# Patient Record
Sex: Female | Born: 1980 | Race: White | Hispanic: No | Marital: Married | Smoking: Never smoker
Health system: Southern US, Community
[De-identification: ages and names within clinical notes are randomized; demographics above are authoritative.]

## PROBLEM LIST (undated history)

## (undated) DIAGNOSIS — D279 Benign neoplasm of unspecified ovary: Secondary | ICD-10-CM

## (undated) DIAGNOSIS — R112 Nausea with vomiting, unspecified: Secondary | ICD-10-CM

## (undated) DIAGNOSIS — Z9889 Other specified postprocedural states: Secondary | ICD-10-CM

## (undated) DIAGNOSIS — N200 Calculus of kidney: Secondary | ICD-10-CM

## (undated) DIAGNOSIS — F411 Generalized anxiety disorder: Secondary | ICD-10-CM

## (undated) DIAGNOSIS — F3281 Premenstrual dysphoric disorder: Secondary | ICD-10-CM

## (undated) DIAGNOSIS — K219 Gastro-esophageal reflux disease without esophagitis: Secondary | ICD-10-CM

## (undated) DIAGNOSIS — R569 Unspecified convulsions: Secondary | ICD-10-CM

## (undated) DIAGNOSIS — Z87442 Personal history of urinary calculi: Secondary | ICD-10-CM

## (undated) HISTORY — DX: Gastro-esophageal reflux disease without esophagitis: K21.9

## (undated) HISTORY — DX: Unspecified convulsions: R56.9

## (undated) HISTORY — PX: AUGMENTATION MAMMAPLASTY: SUR837

## (undated) HISTORY — DX: Calculus of kidney: N20.0

---

## 2004-07-16 HISTORY — PX: AUGMENTATION MAMMAPLASTY: SUR837

## 2010-07-16 DIAGNOSIS — G40109 Localization-related (focal) (partial) symptomatic epilepsy and epileptic syndromes with simple partial seizures, not intractable, without status epilepticus: Secondary | ICD-10-CM

## 2010-07-16 HISTORY — DX: Localization-related (focal) (partial) symptomatic epilepsy and epileptic syndromes with simple partial seizures, not intractable, without status epilepticus: G40.109

## 2011-08-17 HISTORY — PX: HYSTEROSCOPY: SHX211

## 2011-08-17 HISTORY — PX: HYSTEROSCOPY WITH D & C: SHX1775

## 2013-02-04 ENCOUNTER — Encounter: Payer: Self-pay | Admitting: Diagnostic Neuroimaging

## 2013-02-04 ENCOUNTER — Ambulatory Visit (INDEPENDENT_AMBULATORY_CARE_PROVIDER_SITE_OTHER): Payer: BC Managed Care – PPO | Admitting: Diagnostic Neuroimaging

## 2013-02-04 ENCOUNTER — Telehealth: Payer: Self-pay | Admitting: Diagnostic Neuroimaging

## 2013-02-04 VITALS — BP 97/66 | HR 85 | Temp 98.7°F | Ht 64.0 in | Wt 126.0 lb

## 2013-02-04 DIAGNOSIS — R569 Unspecified convulsions: Secondary | ICD-10-CM

## 2013-02-04 MED ORDER — LEVETIRACETAM ER 500 MG PO TB24: 1000.0000 mg | ORAL_TABLET | Freq: Every day | ORAL | Status: DC

## 2013-02-04 NOTE — Patient Instructions (Signed)
Stop regular levetiracetam.  Start levetiracetam XR (extended release) 1000mg  at bedtime.

## 2013-02-04 NOTE — Telephone Encounter (Signed)
Patient is requesting none extended release of generic Keppra. Too expensive.

## 2013-02-04 NOTE — Progress Notes (Signed)
[facility]  PATIENT: Beth Vasquez DOB: June 14, 1981  REFERRING CLINICIAN: Pfeiffer HISTORY FROM: patient  REASON FOR VISIT: new consult   HISTORICAL  CHIEF COMPLAINT:  Chief Complaint  Patient presents with  . Seizures    Np #6    HISTORY OF PRESENT ILLNESS:   32 year old right-handed female here for evaluation of seizure disorder.  Patient was born full-term, no complications with normal development. Family history seizure only notable in patient's mother's uncle. Patient has had 2 episodes of seizure in her life on 01/04/11 and 03/20/11. First seizure, patient was in the car with her husband and suddenly became unresponsive without warning. She has generalized convulsions. Took her about 20 minutes to regain consciousness. She was slowing confused for several hours. She bit her tongue but did not have incontinence. Second episode occurred in a similar fashion to the first. Following the second seizure patient started on levetiracetam 500 mg twice a day. Within a few months, patient was complaining of depression and being in the ears as well as hair loss and therefore levetiracetam dose was decreased to 50 mg the morning and 500 mg at night.  Around this time patient began to have intermittent episodes lasting 1 minute at a time, of expressive and receptive aphasia. She did not lose consciousness. She is able to stand up sit down involuntary move her arms and legs. Sometimes she is able to get one or 2 words out. These events occur once per week, in clusters of up to 5 in a day.  Patient had video EEG monitoring in [place], which she thinks showed left frontal epileptiform discharges.  Patient was pregnant in 2013, and stayed on levetiracetam during the pregnancy. She had no seizures during pregnancy. She gave birth to 2 healthy twin girls, who are now 57 months old.  REVIEW OF SYSTEMS: Full 14 system review of systems performed and notable only for seizure ringing  in ears.  ALLERGIES: No Known Allergies  HOME MEDICATIONS: No outpatient prescriptions prior to visit.   No facility-administered medications prior to visit.    PAST MEDICAL HISTORY: Past Medical History  Diagnosis Date  . Seizures   . Acid reflux     PAST SURGICAL HISTORY: Past Surgical History  Procedure Laterality Date  . Cesarean section    . Hysteroscopy  08/2011    FAMILY HISTORY: Family History  Problem Relation Age of Onset  . Breast cancer Mother     SOCIAL HISTORY:  History   Social History  . Marital Status: Married    Spouse Name: N/A    Number of Children: 2  . Years of Education: 12   Occupational History  . N/A    Social History Main Topics  . Smoking status: Never Smoker   . Smokeless tobacco: Not on file  . Alcohol Use: Yes  . Drug Use: No  . Sexually Active: Not on file   Other Topics Concern  . Not on file   Social History Narrative  . No narrative on file     PHYSICAL EXAM  Filed Vitals:   02/04/13 0848  BP: 97/66  Pulse: 85  Temp: 98.7 F (37.1 C)  TempSrc: Oral  Height: 5\' 4"  (1.626 m)  Weight: 126 lb (57.153 kg)    Not recorded    Body mass index is 21.62 kg/(m^2).  GENERAL EXAM: Patient is in no distress  CARDIOVASCULAR: Regular rate and rhythm, no murmurs, no carotid bruits  NEUROLOGIC: MENTAL STATUS: awake, alert, language fluent,  comprehension intact, naming intact CRANIAL NERVE: no papilledema on fundoscopic exam, pupils equal and reactive to light, visual fields full to confrontation, extraocular muscles intact, no nystagmus, facial sensation and strength symmetric, uvula midline, shoulder shrug symmetric, tongue midline. MOTOR: normal bulk and tone, full strength in the BUE, BLE SENSORY: normal and symmetric to light touch, pinprick, temperature, vibration COORDINATION: finger-nose-finger, fine finger movements normal REFLEXES: deep tendon reflexes present and symmetric GAIT/STATION: narrow based  gait; able to walk on toes, heels and tandem; romberg is negative   DIAGNOSTIC DATA (LABS, IMAGING, TESTING) - I reviewed patient records, labs, notes, testing and imaging myself where available.  No results found for this basename: WBC, HGB, HCT, MCV, PLT   No results found for this basename: na, k, cl, co2, glucose, bun, creatinine, calcium, prot, albumin, ast, alt, alkphos, bilitot, gfrnonaa, gfraa   No results found for this basename: CHOL, HDL, LDLCALC, LDLDIRECT, TRIG, CHOLHDL   No results found for this basename: HGBA1C   No results found for this basename: VITAMINB12   No results found for this basename: TSH     ASSESSMENT AND PLAN  32 y.o. year old female  has a past medical history of Seizures and Acid reflux. here with 2 generalized convulsive seizures in life, and now with intermittent episodes of suspected partial seizures (1 minute of aphasia; once per week).   PLAN: 1. Will change levetiracetam to extended release formulation 1000 mg at bedtime 2. okay to continue driving   Suanne Marker, MD 02/04/2013, 9:37 AM Certified in Neurology, Neurophysiology and Neuroimaging  [facility] [address], [address] [place], [place] [address] [phone]

## 2013-02-06 ENCOUNTER — Telehealth: Payer: Self-pay | Admitting: Diagnostic Neuroimaging

## 2013-02-06 NOTE — Telephone Encounter (Signed)
OV note says: PLAN:  1. Will change levetiracetam to extended release formulation 1000 mg at bedtime I called the pharmacy to ensure they are dispensing generic.  Spoke with Claris Che.  She said they are dispensing generic.  She said they did not have the med in stock and had to order it.  The patients co-pay is $102.  I called the patient back.  She said she wants to go back on regular release because it will be significantly less expensive for her and she cannot afford the co-pay for the XR.  Okay to change?  Please advise.  Thank you.

## 2013-02-09 MED ORDER — LEVETIRACETAM 500 MG PO TABS: 500.0000 mg | ORAL_TABLET | Freq: Two times a day (BID) | ORAL | Status: DC

## 2013-02-09 NOTE — Telephone Encounter (Signed)
I consulted Dr Marjory Lies.  He authorized med change back to Keppra 500mg  BID regular release.  I have updated Rx and sent it to the pharmacy.  I also called the patient to advise.  She verbalized understanding.

## 2013-03-12 DIAGNOSIS — Z0289 Encounter for other administrative examinations: Secondary | ICD-10-CM

## 2013-03-25 NOTE — Telephone Encounter (Signed)
This encounter was created in error - please disregard.

## 2013-03-27 ENCOUNTER — Telehealth: Payer: Self-pay

## 2013-03-27 NOTE — Telephone Encounter (Signed)
I called and left patient a VM that DMV forms have been faxed and a copy mailed to her.

## 2013-05-28 ENCOUNTER — Ambulatory Visit: Payer: BC Managed Care – PPO | Admitting: Diagnostic Neuroimaging

## 2013-07-21 ENCOUNTER — Telehealth: Payer: Self-pay | Admitting: Diagnostic Neuroimaging

## 2013-07-21 NOTE — Telephone Encounter (Signed)
Patient reports that she has not had significant seizures in about two years. Recently, she has been having seizures and would like to be assessed and medication changes made as appropriate.

## 2013-07-21 NOTE — Telephone Encounter (Signed)
Pt calling has an appt with [facility] in march but wants to be seen sooner, states she is having seizures again after two years and is anxious. Please call

## 2013-07-22 ENCOUNTER — Encounter: Payer: Self-pay | Admitting: Nurse Practitioner

## 2013-07-22 ENCOUNTER — Ambulatory Visit (INDEPENDENT_AMBULATORY_CARE_PROVIDER_SITE_OTHER): Payer: BC Managed Care – PPO | Admitting: Nurse Practitioner

## 2013-07-22 ENCOUNTER — Encounter (INDEPENDENT_AMBULATORY_CARE_PROVIDER_SITE_OTHER): Payer: Self-pay

## 2013-07-22 VITALS — BP 110/77 | HR 76 | Temp 97.8°F | Ht 64.0 in | Wt 130.0 lb

## 2013-07-22 DIAGNOSIS — G40201 Localization-related (focal) (partial) symptomatic epilepsy and epileptic syndromes with complex partial seizures, not intractable, with status epilepticus: Secondary | ICD-10-CM

## 2013-07-22 DIAGNOSIS — G40209 Localization-related (focal) (partial) symptomatic epilepsy and epileptic syndromes with complex partial seizures, not intractable, without status epilepticus: Secondary | ICD-10-CM | POA: Insufficient documentation

## 2013-07-22 DIAGNOSIS — R569 Unspecified convulsions: Secondary | ICD-10-CM | POA: Insufficient documentation

## 2013-07-22 MED ORDER — LACOSAMIDE 100 MG PO TABS: 100.0000 mg | ORAL_TABLET | Freq: Two times a day (BID) | ORAL | Status: DC

## 2013-07-22 NOTE — Progress Notes (Signed)
PATIENT: Beth Vasquez DOB: 11/27/1980   REASON FOR VISIT: earlier follow up, more frequent seizures  HISTORY FROM: patient  HISTORY OF PRESENT ILLNESS: 02/04/13 (VP): 33 year old right-handed female here for evaluation of seizure disorder.  Patient was born full-term, no complications with normal development. Family history seizure only notable in patient's mother's uncle. Patient has had 2 episodes of seizure in her life on 01/04/11 and 03/20/11. First seizure, patient was in the car with her husband and suddenly became unresponsive without warning. She has generalized convulsions. Took her about 20 minutes to regain consciousness. She was slowing confused for several hours. She bit her tongue but did not have incontinence. Second episode occurred in a similar fashion to the first. Following the second seizure patient started on levetiracetam 500 mg twice a day. Within a few months, patient was complaining of depression and being in the ears as well as hair loss and therefore levetiracetam dose was decreased to 500 mg the morning and 500 mg at night.  Around this time patient began to have intermittent episodes lasting 1 minute at a time, of expressive and receptive aphasia. She did not lose consciousness. She is able to stand up sit down involuntary move her arms and legs. Sometimes she is able to get one or 2 words out. These events occur once per week, in clusters of up to 5 in a day.  Patient had video EEG monitoring in [place], which she thinks showed left frontal epileptiform discharges.  Patient was pregnant in 2013, and stayed on levetiracetam during the pregnancy. She had no seizures during pregnancy. She gave birth to 2 healthy twin girls, who are now 62 months old.  Patient reports that she has not had significant seizures in about two years. Recently, she has been having seizures and would like to be assessed and medication changes made as appropriate.   UPDATE 07/22/13 (LL): Patient  returns for earlier revisit due to increased partial seizures.  She has almost daily episodes of being unable to speak and staring, but knows what is going on around her.  Other times she gets an aura of a seizure, with a deja vu feeling, but then it never happens.  Sometimes this is followed by headache and tiredness.  Her dose of Keppra is 250 mg in the morning and 500 mg at night.  At higher dose she states she had depression, worse ringing in the ears and hair falling out.  Her last EEG was in [place] and was video-monitored.  She thinks her last MRI was 2 years ago.  REVIEW OF SYSTEMS: Full 14 system review of systems performed and notable only for seizure ringing in ears, confusion, seizures, dizziness.  ALLERGIES: No Known Allergies  HOME MEDICATIONS: Outpatient Prescriptions Prior to Visit  Medication Sig Dispense Refill  . pantoprazole (PROTONIX) 40 MG tablet Take 40 mg by mouth daily.      Marland Kitchen levETIRAcetam (KEPPRA) 500 MG tablet Take 1 tablet (500 mg total) by mouth 2 (two) times daily.  60 tablet  12   No facility-administered medications prior to visit.    PAST MEDICAL HISTORY: Past Medical History  Diagnosis Date  . Seizures   . Acid reflux     PAST SURGICAL HISTORY: Past Surgical History  Procedure Laterality Date  . Cesarean section    . Hysteroscopy  08/2011    FAMILY HISTORY: Family History  Problem Relation Age of Onset  . Breast cancer Mother     SOCIAL HISTORY: History  Social History  . Marital Status: Married    Spouse Name: Beth Vasquez    Number of Children: 2  . Years of Education: College   Occupational History  . N/A    Social History Main Topics  . Smoking status: Never Smoker   . Smokeless tobacco: Never Used  . Alcohol Use: Yes  . Drug Use: No  . Sexual Activity: Not on file   Other Topics Concern  . Not on file   Social History Narrative   Patient lives at home with family.   Caffeine Use: 1 tea daily in a.m     PHYSICAL  EXAM  Filed Vitals:   07/22/13 1502  BP: 110/77  Pulse: 76  Temp: 97.8 F (36.6 C)  TempSrc: Oral  Height: 5\' 4"  (1.626 m)  Weight: 130 lb (58.968 kg)   Body mass index is 22.3 kg/(m^2).  Generalized: Well developed, in no acute distress  Head: normocephalic and atraumatic. Oropharynx benign  Neck: Supple, no carotid bruits  Cardiac: Regular rate rhythm, no murmur  Musculoskeletal: No deformity   Neurological examination  Mentation: Alert oriented to time, place, history taking. Follows all commands speech and language fluent Cranial nerve II-XII:  Pupils were equal round reactive to light extraocular movements were full, visual field were full on confrontational test. Facial sensation and strength were normal. hearing was intact to finger rubbing bilaterally. Uvula tongue midline. head turning and shoulder shrug and were normal and symmetric.Tongue protrusion into cheek strength was normal. Motor: normal bulk and tone, full strength in the BUE, BLE, fine finger movements normal, no pronator drift. No focal weakness Sensory: normal and symmetric to light touch, pinprick, and  vibration  Coordination: finger-nose-finger, heel-to-shin bilaterally, no dysmetria Reflexes:  Deep tendon reflexes in the upper and lower extremities are present and symmetric.  Gait and Station: Rising up from seated position without assistance, normal stance, without trunk ataxia, moderate stride, good arm swing, smooth turning, able to perform tiptoe, and heel walking without difficulty.   ASSESSMENT AND PLAN 33 y.o. year old female has a past medical history of Seizures and Acid reflux here with 2 generalized convulsive seizures in life, and now with intermittent episodes of suspected partial seizures (1 minute of aphasia; once per week).  PLAN:  1. Start Vimpat titration pack and continue Keppra until you have taken all in the starter pack. When you have finished starter pack decrease Keppra by 250 mg daily  every 2 weeks until off. 2. okay to continue driving 3. Check EEG. 4. Keep scheduled follow up with Dr. Leta Vasquez.  Orders Placed This Encounter  Procedures  . EEG adult   Meds ordered this encounter  Medications  . levETIRAcetam (KEPPRA) 500 MG tablet    Sig: Take 500 mg by mouth 2 (two) times daily. 1.5 tab in a.m; 1 tab in evening; total of 750mg  qd  . Lacosamide 100 MG TABS    Sig: Take 1 tablet (100 mg total) by mouth 2 (two) times daily.    Dispense:  60 tablet    Refill:  12    Order Specific Question:  Supervising Provider    Answer:  Garvin Fila [[id number]]   Return if symptoms worsen or fail to improve.  Philmore Pali, MSN, NP-C 07/22/2013, 5:18 PM [facility] [address], [place], [place] [address] [phone]  Note: This document was prepared with digital dictation and possible smart phrase technology. Any transcriptional errors that result from this process are  unintentional.

## 2013-07-22 NOTE — Patient Instructions (Signed)
Start Vimpat titration pack and continue Keppra until you have taken all in the starter pack.  When you have finished starter pack decrease Keppra by 250 mg daily every 2 weeks until off.  EEG here in the office, someone will call to schedule.  Keep next follow up appt with Dr. Leta Baptist.

## 2013-07-23 ENCOUNTER — Telehealth: Payer: Self-pay | Admitting: Diagnostic Neuroimaging

## 2013-07-23 NOTE — Telephone Encounter (Signed)
PT SAW LYNN LAM YESTERDAY AND SHE CHANGED HER MEDICATION--HAS QUESTIONS ABOUT VIMPAT LYNN PRESCRIBED FOR SEIZURES

## 2013-07-24 NOTE — Telephone Encounter (Signed)
I consulted Dr. Leta Baptist about this pt.  He stated that pt will be covered for both partial and generalized seizures on vimpat.  He is aware of her concern and questions.  He thinks vimpat would be the best for her.  She stated that she would start this medication.  She is to call back if questions or problems.

## 2013-07-24 NOTE — Telephone Encounter (Signed)
Dr. Leta Baptist , please address per pt.   She is wondering whether lamictal might be better option for her, since she has been reading about the vimpat and she started on keppra with grand mal seizures.  She has had some breakthru sz and this is the reason now she is being changed to different medication.    Reading she states that vimpat for partial seizures.  I relayed that will send to Dr. Leta Baptist, even though [place] saw pt.  [phone].

## 2013-07-30 ENCOUNTER — Telehealth: Payer: Self-pay | Admitting: *Deleted

## 2013-07-30 NOTE — Telephone Encounter (Signed)
What would you suggest?  Vimpat is too expensive, not on formulary.  -LL

## 2013-07-31 MED ORDER — LAMOTRIGINE 25 MG PO TABS: ORAL_TABLET | ORAL | Status: DC

## 2013-07-31 NOTE — Telephone Encounter (Signed)
Called Beth Vasquez to discuss changing medications.  She is to titrate off Vimpat, take 50 mg x 1 week then stop.  Lamotrigine 25 mg was sent to pharmacy.  She is to Take 25 mg daily x 2 weeks, then 25 mg BID x 2 weeks, 50 mg BID x 2 weeks, then 75 mg BID x 2 weeks, then final dose 100 mg BID.  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  Continue Keppra at current dose.  She acknowledged instructions and had no further questions. -LL

## 2013-09-18 ENCOUNTER — Ambulatory Visit: Payer: BC Managed Care – PPO | Admitting: Diagnostic Neuroimaging

## 2013-10-14 ENCOUNTER — Encounter: Payer: Self-pay | Admitting: Diagnostic Neuroimaging

## 2013-10-14 ENCOUNTER — Ambulatory Visit (INDEPENDENT_AMBULATORY_CARE_PROVIDER_SITE_OTHER): Payer: BC Managed Care – PPO | Admitting: Diagnostic Neuroimaging

## 2013-10-14 VITALS — BP 104/73 | HR 87 | Temp 98.6°F | Ht 64.0 in | Wt 120.5 lb

## 2013-10-14 DIAGNOSIS — G40109 Localization-related (focal) (partial) symptomatic epilepsy and epileptic syndromes with simple partial seizures, not intractable, without status epilepticus: Secondary | ICD-10-CM

## 2013-10-14 MED ORDER — LEVETIRACETAM 500 MG PO TABS: ORAL_TABLET | ORAL | Status: DC

## 2013-10-14 MED ORDER — LAMOTRIGINE 100 MG PO TABS: 100.0000 mg | ORAL_TABLET | Freq: Two times a day (BID) | ORAL | Status: DC

## 2013-10-14 NOTE — Patient Instructions (Signed)
Continue lamotrigine and levetiracetam.  Monitor seizure frequency.

## 2013-10-14 NOTE — Progress Notes (Signed)
PATIENT: Beth Vasquez DOB: [birth year]   REASON FOR VISIT: earlier follow up, more frequent seizures  HISTORY FROM: patient  HISTORY OF PRESENT ILLNESS:  UPDATE 10/14/13: Since last visit, tried vimpat but was too expensive. Now on LTG 100mg  BID, and doing better. At LTG 75mg  BID, was event free x 2 weeks. Now on LTG 100mg  BID x 2 weeks, and only had 1 event yesterday. Events are consistent with 30 seconds of exp and receptive aphasia. No confusion, convulsions or staring. Able to walk, move and function. She is awake/aware when these are occuring.   UPDATE 07/22/13 (LL): Patient returns for earlier revisit due to increased partial seizures.  She has almost daily episodes of being unable to speak and staring, but knows what is going on around her.  Other times she gets an aura of a seizure, with a deja vu feeling, but then it never happens.  Sometimes this is followed by headache and tiredness.  Her dose of Keppra is 250 mg in the morning and 500 mg at night.  At higher dose she states she had depression, worse ringing in the ears and hair falling out.  Her last EEG was in [place] and was video-monitored.  She thinks her last MRI was 2 years ago.  PRIOR HPI 02/04/13 (VP): 33 year old right-handed female here for evaluation of seizure disorder.   Patient was born full-term, no complications with normal development. Family history seizure only notable in patient's mother's uncle. Patient has had 2 episodes of seizure in her life on 01/04/11 and 03/20/11. First seizure, patient was in the car with her husband and suddenly became unresponsive without warning. She has generalized convulsions. Took her about 20 minutes to regain consciousness. She was slowing confused for several hours. She bit her tongue but did not have incontinence. Second episode occurred in a similar fashion to the first. Following the second seizure patient started on levetiracetam 500 mg twice a day. Within a few months, patient was  complaining of depression and being in the ears as well as hair loss and therefore levetiracetam dose was decreased to 500 mg the morning and 500 mg at night.   Around this time patient began to have intermittent episodes lasting 1 minute at a time, of expressive and receptive aphasia. She did not lose consciousness. She is able to stand up sit down involuntary move her arms and legs. Sometimes she is able to get one or 2 words out. These events occur once per week, in clusters of up to 5 in a day.   Patient had video EEG monitoring in [place], which she thinks showed left frontal epileptiform discharges.  Patient was pregnant in 2013, and stayed on levetiracetam during the pregnancy. She had no seizures during pregnancy. She gave birth to 2 healthy twin girls, who are now 67 months old.   Patient reports that she has not had significant seizures in about two years. Recently, she has been having seizures and would like to be assessed and medication changes made as appropriate.    REVIEW OF SYSTEMS: Full 14 system review of systems performed and notable only for lang diff.    ALLERGIES: No Known Allergies  HOME MEDICATIONS: Outpatient Prescriptions Prior to Visit  Medication Sig Dispense Refill  . lamoTRIgine (LAMICTAL) 25 MG tablet Take 25 mg daily x 2 weeks, then 25 mg BID x 2 weeks, 50 mg BID x 2 weeks, then 75 mg BID x 2 weeks, then final dose 100 mg BID.  240 tablet  3  . levETIRAcetam (KEPPRA) 500 MG tablet Take by mouth. Half tab in AM; 1 tab in evening; total of 750mg  daily      . pantoprazole (PROTONIX) 40 MG tablet Take 40 mg by mouth daily.       No facility-administered medications prior to visit.    PAST MEDICAL HISTORY: Past Medical History  Diagnosis Date  . Seizures   . Acid reflux     PAST SURGICAL HISTORY: Past Surgical History  Procedure Laterality Date  . Cesarean section    . Hysteroscopy  08/2011    FAMILY HISTORY: Family History  Problem Relation Age of  Onset  . Breast cancer Mother     SOCIAL HISTORY: History   Social History  . Marital Status: Married    Spouse Name: [name]    Number of Children: 2  . Years of Education: College   Occupational History  . N/A    Social History Main Topics  . Smoking status: Never Smoker   . Smokeless tobacco: Never Used  . Alcohol Use: Yes  . Drug Use: No  . Sexual Activity: Not on file   Other Topics Concern  . Not on file   Social History Narrative   Patient lives at home with family.   Caffeine Use: 1 tea daily in a.m     PHYSICAL EXAM  Filed Vitals:   10/14/13 0828  BP: 104/73  Pulse: 87  Temp: 98.6 F (37 C)  TempSrc: Oral  Height: 5\' 4"  (1.626 m)  Weight: 120 lb 8 oz (54.658 kg)   Body mass index is 20.67 kg/(m^2).  GENERAL EXAM: Patient is in no distress; well developed, nourished and groomed; neck is supple  CARDIOVASCULAR: Regular rate and rhythm, no murmurs, no carotid bruits  NEUROLOGIC: MENTAL STATUS: awake, alert, oriented to person, place and time, recent and remote memory intact, normal attention and concentration, language fluent, comprehension intact, naming intact, fund of knowledge appropriate CRANIAL NERVE: no papilledema on fundoscopic exam, pupils equal and reactive to light, visual fields full to confrontation, extraocular muscles intact, no nystagmus, facial sensation and strength symmetric, hearing intact, palate elevates symmetrically, uvula midline, shoulder shrug symmetric, tongue midline. MOTOR: normal bulk and tone, full strength in the BUE, BLE SENSORY: normal and symmetric to light touch COORDINATION: finger-nose-finger, fine finger movements normal REFLEXES: deep tendon reflexes present and symmetric GAIT/STATION: narrow based gait; able to walk tandem; romberg is negative .     ASSESSMENT AND PLAN 33 y.o. year old female with 2 generalized convulsive seizures in life, and now with intermittent episodes of suspected partial seizures  (30-60 seconds of aphasia; several per week). Previous EEG showed left frontal discharges.  Dx: localization/partial epilepsy  PLAN:  1. Contine LTG 100mg  BID 2. Continue LEV 250/500 3. Ok to drive from my standpoint (no LOC or convulsions with current spells), but she is appealing a DMV ruling  Return in about 3 months (around 01/13/2014).    Penni Bombard, MD 08/22/8339, 9:62 AM Certified in Neurology, Neurophysiology and Neuroimaging  [facility] [address] [place], [place] [address] [phone]

## 2013-10-19 ENCOUNTER — Telehealth: Payer: Self-pay | Admitting: Diagnostic Neuroimaging

## 2013-10-19 NOTE — Telephone Encounter (Signed)
I called back and spoke with the patient.  Said she has had great anxiety since starting Lamictal.  Says she has now titrated up to full dose of 100mg  BID, and since last Thursday she has seen disturbing images.  States she saw a knife on the counter and the image of the knife keeps flashing in her head.  Says she does not have thoughts of harming herself or others, but she is very uneasy about seeing the knife reoccurring in her mind.  She asked if she could titrate off of the Lamictal.  Said she only took 75mg  this morning rather than her 100mg  dose.   Dr Leta Baptist is out of the office,  [place] request to [facility].  Please advise.  Thank you.

## 2013-10-19 NOTE — Telephone Encounter (Signed)
Patient calling to state that Dr. Leta Baptist placed her on new medication and she is experiencing some bad side effects, such as "bad mental pictures" which she describes as "scary." Patient would like a call back today regarding coming off of the medication since this is something that she does not want to be on anymore. Please call and advise patient.

## 2013-10-19 NOTE — Telephone Encounter (Signed)
Please have her decrease to lamictal 50mg  twice a day for 1 week, then decrease to 25mg  twice a day for one week and then discontinue. Please see if we can get her scheduled for a follow up with [facility] Hawking this week or next to see about the need for another AED. Thanks.

## 2013-10-21 NOTE — Telephone Encounter (Signed)
Called pt to resch appt with Dr. Leta Baptist, per Dr. Janann Colonel on 11/03/13. Pt verbalized understanding.

## 2013-10-21 NOTE — Telephone Encounter (Signed)
Called pt to inform her per Dr. Janann Colonel, to decrease the lamictal to 50 mg twice a day for 1 wk, then decrease to 25 mg twice a day for 1 wk and then discontinue. I made an appt for pt per Dr. Janann Colonel, on 10/22/13 with lynn, NP. I advised the pt that if she has any other problems, questions or concerns to call the office. Pt verbalized understanding.

## 2013-10-21 NOTE — Telephone Encounter (Signed)
Patient was worked in for an appt tomorrow--she cannot keep appt tomorrow because has no babysitter--please call to resch--she is having issues with seizure medication.

## 2013-10-21 NOTE — Telephone Encounter (Signed)
Pt called is wanting someone to call her back where she called Monday. Please call pt pertaining to Dr. Hazle Quant message below. Thanks

## 2013-10-22 ENCOUNTER — Ambulatory Visit: Payer: Self-pay | Admitting: Nurse Practitioner

## 2013-11-03 ENCOUNTER — Encounter: Payer: Self-pay | Admitting: Diagnostic Neuroimaging

## 2013-11-03 ENCOUNTER — Ambulatory Visit (INDEPENDENT_AMBULATORY_CARE_PROVIDER_SITE_OTHER): Payer: BC Managed Care – PPO | Admitting: Diagnostic Neuroimaging

## 2013-11-03 VITALS — BP 112/75 | HR 92 | Ht 64.0 in | Wt 118.0 lb

## 2013-11-03 DIAGNOSIS — G40209 Localization-related (focal) (partial) symptomatic epilepsy and epileptic syndromes with complex partial seizures, not intractable, without status epilepticus: Secondary | ICD-10-CM

## 2013-11-03 DIAGNOSIS — G40109 Localization-related (focal) (partial) symptomatic epilepsy and epileptic syndromes with simple partial seizures, not intractable, without status epilepticus: Secondary | ICD-10-CM

## 2013-11-03 MED ORDER — LACOSAMIDE 100 MG PO TABS: 100.0000 mg | ORAL_TABLET | Freq: Two times a day (BID) | ORAL | Status: DC

## 2013-11-03 NOTE — Patient Instructions (Signed)
Today, reduce lamotrigine to 25mg  daily for next 1 week, then stop it.  Today, start vimpat pack (increasing dose per pack instructions). Then transition to vimpat 100mg  twice day prescription.  Continue levetiracetam (250mg  in AM and 500mg  in PM).

## 2013-11-03 NOTE — Progress Notes (Signed)
[facility]  PATIENT: Beth Vasquez DOB: 1980-07-24  REFERRING CLINICIAN:  HISTORY FROM: patient  REASON FOR VISIT: follow up    HISTORICAL  CHIEF COMPLAINT:  Chief Complaint  Patient presents with  . Follow-up    HISTORY OF PRESENT ILLNESS:   UPDATE 11/03/13: Since last visit, continued on LTG 100mg  BID, but started having anxiety attacks and "disturbing" images in her mind (knife etc). She was advised to taper off LTG by my colleagues in my absence. Since that time, she has come down to LTG 25mg  BID, and the anxiety / images have subsided. Now her aphasia / partial seizures are back up to 2 per day. No grand mal seizures since 2012.  UPDATE 10/14/13: Since last visit, tried vimpat but was too expensive. Now on LTG 100mg  BID, and doing better. At LTG 75mg  BID, was event free x 2 weeks. Now on LTG 100mg  BID x 2 weeks, and only had 1 event yesterday. Events are consistent with 30 seconds of exp and receptive aphasia. No confusion, convulsions or staring. Able to walk, move and function. She is awake/aware when these are occuring.   UPDATE 07/22/13 (LL): Patient returns for earlier revisit due to increased partial seizures. She has almost daily episodes of being unable to speak and staring, but knows what is going on around her. Other times she gets an aura of a seizure, with a deja vu feeling, but then it never happens. Sometimes this is followed by headache and tiredness. Her dose of Keppra is 250 mg in the morning and 500 mg at night. At higher dose she states she had depression, worse ringing in the ears and hair falling out. Her last EEG was in [place] and was video-monitored. She thinks her last MRI was 2 years ago.   PRIOR HPI 02/04/13 (VP): 33 year old right-handed female here for evaluation of seizure disorder.   Patient was born full-term, no complications with normal development. Family history seizure only notable in patient's mother's uncle. Patient has had 2  episodes of seizure in her life on 01/04/11 and 03/20/11. First seizure, patient was in the car with her husband and suddenly became unresponsive without warning. She has generalized convulsions. Took her about 20 minutes to regain consciousness. She was slowing confused for several hours. She bit her tongue but did not have incontinence. Second episode occurred in a similar fashion to the first. Following the second seizure patient started on levetiracetam 500 mg twice a day. Within a few months, patient was complaining of depression and being in the ears as well as hair loss and therefore levetiracetam dose was decreased to 500 mg the morning and 500 mg at night.   Around this time patient began to have intermittent episodes lasting 1 minute at a time, of expressive and receptive aphasia. She did not lose consciousness. She is able to stand up sit down involuntary move her arms and legs. Sometimes she is able to get one or 2 words out. These events occur once per week, in clusters of up to 5 in a day.   Patient had video EEG monitoring in [place], which she thinks showed left frontal epileptiform discharges.   Patient was pregnant in 2013, and stayed on levetiracetam during the pregnancy. She had no seizures during pregnancy. She gave birth to 2 healthy twin girls, who are now 58 months old.   Patient reports that she has not had significant seizures in about two years. Recently, she has been having seizures and would  like to be assessed and medication changes made as appropriate.    REVIEW OF SYSTEMS: Full 14 system review of systems performed and notable only for only as per history of present illness otherwise negative.  ALLERGIES: No Known Allergies  HOME MEDICATIONS: Outpatient Prescriptions Prior to Visit  Medication Sig Dispense Refill  . BIOTIN PO Take by mouth.      . levETIRAcetam (KEPPRA) 500 MG tablet Half tab in AM; 1 tab in PM; total of 750mg  daily  180 tablet  4  . Multiple  Vitamins-Minerals (WOMENS MULTIVITAMIN PLUS PO) Take 1 tablet by mouth daily.      Marland Kitchen lamoTRIgine (LAMICTAL) 100 MG tablet Take 1 tablet (100 mg total) by mouth 2 (two) times daily.  180 tablet  4   No facility-administered medications prior to visit.    PAST MEDICAL HISTORY: Past Medical History  Diagnosis Date  . Seizures   . Acid reflux     PAST SURGICAL HISTORY: Past Surgical History  Procedure Laterality Date  . Cesarean section    . Hysteroscopy  08/2011    FAMILY HISTORY: Family History  Problem Relation Age of Onset  . Breast cancer Mother     SOCIAL HISTORY:  History   Social History  . Marital Status: Married    Spouse Name: [name]    Number of Children: 2  . Years of Education: College   Occupational History  . N/A    Social History Main Topics  . Smoking status: Never Smoker   . Smokeless tobacco: Never Used  . Alcohol Use: Yes  . Drug Use: No  . Sexual Activity: Not on file   Other Topics Concern  . Not on file   Social History Narrative   Patient lives at home with family.   Caffeine Use: 1 tea daily in a.m     PHYSICAL EXAM  Filed Vitals:   11/03/13 1334  BP: 112/75  Pulse: 92  Height: 5\' 4"  (1.626 m)  Weight: 118 lb (53.524 kg)    Not recorded    Body mass index is 20.24 kg/(m^2).  GENERAL EXAM: Patient is in no distress; well developed, nourished and groomed; neck is supple  CARDIOVASCULAR: Regular rate and rhythm, no murmurs, no carotid bruits  NEUROLOGIC: MENTAL STATUS: awake, alert, oriented to person, place and time, recent and remote memory intact, normal attention and concentration, language fluent, comprehension intact, naming intact, fund of knowledge appropriate CRANIAL NERVE: no papilledema on fundoscopic exam, pupils equal and reactive to light, visual fields full to confrontation, extraocular muscles intact, no nystagmus, facial sensation and strength symmetric, hearing intact, palate elevates symmetrically, uvula  midline, shoulder shrug symmetric, tongue midline. MOTOR: normal bulk and tone, full strength in the BUE, BLE SENSORY: normal and symmetric to light touch, pinprick, temperature, vibration COORDINATION: finger-nose-finger, fine finger movements normal REFLEXES: deep tendon reflexes present and symmetric GAIT/STATION: narrow based gait; able to walk on toes, heels and tandem; romberg is negative    DIAGNOSTIC DATA (LABS, IMAGING, TESTING) - I reviewed patient records, labs, notes, testing and imaging myself where available.  No results found for this basename: WBC, HGB, HCT, MCV, PLT   No results found for this basename: na, k, cl, co2, glucose, bun, creatinine, calcium, prot, albumin, ast, alt, alkphos, bilitot, gfrnonaa, gfraa   No results found for this basename: CHOL, HDL, LDLCALC, LDLDIRECT, TRIG, CHOLHDL   No results found for this basename: HGBA1C   No results found for this basename: VITAMINB12   No  results found for this basename: TSH    11/16/11 - 11/17/11 VEEG ([facility], report) - left anterior temporal spike, slow wave discharges and intermittent focal slowing over left temporal region.   ASSESSMENT AND PLAN  33 y.o. 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 However, now having intermittent partial seizures (1-2 aphasia spells per day, 30-60 seconds at a time) without motor symptoms or alteration of consciousness. Was doing well on LEV 250 / 500 + LTG 100 BID, but then had side effects from LTG (anxiety / mood / disturbing mental images).   PLAN: - taper off LTG (due to side effects) - retry vimpat (previously stopped due to financial decision, but this time she is deciding to cover cost) - ok to drive from my standpoint (no convulsive seizures since 2012; she is compliant with medications; current spells/seizures are partial only, aphasia  only, and do not affect her ability to drive)  Meds ordered this encounter  Medications  . DISCONTD: lamoTRIgine (LAMICTAL) 25 MG tablet    Sig: Take 25 mg by mouth 2 (two) times daily.  . Lacosamide (VIMPAT) 100 MG TABS    Sig: Take 1 tablet (100 mg total) by mouth 2 (two) times daily.    Dispense:  60 tablet    Refill:  5   Return in about 3 months (around 02/02/2014).   Penni Bombard, MD 01/19/2375, 2:83 PM Certified in Neurology, Neurophysiology and Neuroimaging  [facility] [address], [place], [place] [address] [phone]

## 2013-12-01 ENCOUNTER — Telehealth: Payer: Self-pay | Admitting: Diagnostic Neuroimaging

## 2013-12-01 NOTE — Telephone Encounter (Signed)
Patient calling to request change in medication due to Lacosamide (VIMPAT) 100 MG TABS is not working.  Still having episodes, please call and advise

## 2013-12-02 NOTE — Telephone Encounter (Signed)
I spoke with patient. Will continue on vimpat for next 1-2 months. She thinks the disturbing mental image side effect may be related to her menstrual cycle. She will see her Ob/Gyn. -VRP

## 2014-01-18 ENCOUNTER — Ambulatory Visit: Payer: BC Managed Care – PPO | Admitting: Diagnostic Neuroimaging

## 2014-12-22 ENCOUNTER — Ambulatory Visit: Payer: Self-pay | Admitting: Diagnostic Neuroimaging

## 2014-12-28 ENCOUNTER — Encounter: Payer: Self-pay | Admitting: Diagnostic Neuroimaging

## 2014-12-28 ENCOUNTER — Ambulatory Visit (INDEPENDENT_AMBULATORY_CARE_PROVIDER_SITE_OTHER): Payer: BLUE CROSS/BLUE SHIELD | Admitting: Diagnostic Neuroimaging

## 2014-12-28 VITALS — BP 100/64 | HR 72 | Ht 64.0 in | Wt 109.2 lb

## 2014-12-28 DIAGNOSIS — G40109 Localization-related (focal) (partial) symptomatic epilepsy and epileptic syndromes with simple partial seizures, not intractable, without status epilepticus: Secondary | ICD-10-CM | POA: Insufficient documentation

## 2014-12-28 MED ORDER — ZONISAMIDE 100 MG PO CAPS: 200.0000 mg | ORAL_CAPSULE | Freq: Every day | ORAL | Status: DC

## 2014-12-28 MED ORDER — LACOSAMIDE 100 MG PO TABS: 100.0000 mg | ORAL_TABLET | Freq: Two times a day (BID) | ORAL | Status: DC

## 2014-12-28 NOTE — Progress Notes (Signed)
[facility]  PATIENT: Beth Vasquez DOB: Oct 15, 1980  REFERRING CLINICIAN:  HISTORY FROM: patient  REASON FOR VISIT: follow up    HISTORICAL  CHIEF COMPLAINT:  Chief Complaint  Patient presents with  . Follow-up    localization-related epilepsy    HISTORY OF PRESENT ILLNESS:   UPDATE 12/28/14: Since last visit, doing much better. Went to [facility] for second opinion, continued vimpat, and then LEV was switched to [facility]. A few months ago, tried cutting out sugars/carb to lose some weight, and noticed that her seizures almost stopped. Now occ if she has sugars for 2-3 days, then she may have a few breakthrough partial seizures (aphasia x 20 sec).   UPDATE 11/03/13: Since last visit, continued on LTG 100mg  BID, but started having anxiety attacks and "disturbing" images in her mind (knife etc). She was advised to taper off LTG by my colleagues in my absence. Since that time, she has come down to LTG 25mg  BID, and the anxiety / images have subsided. Now her aphasia / partial seizures are back up to 2 per day. No grand mal seizures since 2012.  UPDATE 10/14/13: Since last visit, tried vimpat but was too expensive. Now on LTG 100mg  BID, and doing better. At LTG 75mg  BID, was event free x 2 weeks. Now on LTG 100mg  BID x 2 weeks, and only had 1 event yesterday. Events are consistent with 30 seconds of exp and receptive aphasia. No confusion, convulsions or staring. Able to walk, move and function. She is awake/aware when these are occuring.   UPDATE 07/22/13 (LL): Patient returns for earlier revisit due to increased partial seizures. She has almost daily episodes of being unable to speak and staring, but knows what is going on around her. Other times she gets an aura of a seizure, with a deja vu feeling, but then it never happens. Sometimes this is followed by headache and tiredness. Her dose of Keppra is 250 mg in the morning and 500 mg at night. At higher dose she states she had  depression, worse ringing in the ears and hair falling out. Her last EEG was in [place] and was video-monitored. She thinks her last MRI was 2 years ago.   PRIOR HPI 02/04/13 (VP): 34 year old right-handed female here for evaluation of seizure disorder. Patient was born full-term, no complications with normal development. Family history seizure only notable in patient's mother's uncle. Patient has had 2 episodes of seizure in her life on 01/04/11 and 03/20/11. First seizure, patient was in the car with her husband and suddenly became unresponsive without warning. She has generalized convulsions. Took her about 20 minutes to regain consciousness. She was slowing confused for several hours. She bit her tongue but did not have incontinence. Second episode occurred in a similar fashion to the first. Following the second seizure patient started on levetiracetam 500 mg twice a day. Within a few months, patient was complaining of depression and being in the ears as well as hair loss and therefore levetiracetam dose was decreased to 500 mg the morning and 500 mg at night. Around this time patient began to have intermittent episodes lasting 1 minute at a time, of expressive and receptive aphasia. She did not lose consciousness. She is able to stand up sit down involuntary move her arms and legs. Sometimes she is able to get one or 2 words out. These events occur once per week, in clusters of up to 5 in a day.  Patient had video EEG monitoring in  [place], which she thinks showed left frontal epileptiform discharges. Patient was pregnant in 2013, and stayed on levetiracetam during the pregnancy. She had no seizures during pregnancy. She gave birth to 2 healthy twin girls, who are now 52 months old. Patient reports that she has not had significant seizures in about two years. Recently, she has been having seizures and would like to be assessed and medication changes made as appropriate.   REVIEW OF SYSTEMS: Full 14 system review  of systems performed and notable only for only as per history of present illness otherwise negative.  ALLERGIES: No Known Allergies  HOME MEDICATIONS: Outpatient Prescriptions Prior to Visit  Medication Sig Dispense Refill  . BIOTIN PO Take by mouth.    . Multiple Vitamins-Minerals (WOMENS MULTIVITAMIN PLUS PO) Take 1 tablet by mouth daily.    . Lacosamide (VIMPAT) 100 MG TABS Take 1 tablet (100 mg total) by mouth 2 (two) times daily. 60 tablet 5  . levETIRAcetam (KEPPRA) 500 MG tablet Half tab in AM; 1 tab in PM; total of 750mg  daily 180 tablet 4   No facility-administered medications prior to visit.    PAST MEDICAL HISTORY: Past Medical History  Diagnosis Date  . Seizures   . Acid reflux     PAST SURGICAL HISTORY: Past Surgical History  Procedure Laterality Date  . Cesarean section    . Hysteroscopy  08/2011    FAMILY HISTORY: Family History  Problem Relation Age of Onset  . Breast cancer Mother     SOCIAL HISTORY:  History   Social History  . Marital Status: Married    Spouse Name: [name]  . Number of Children: 2  . Years of Education: College   Occupational History  . N/A    Social History Main Topics  . Smoking status: Never Smoker   . Smokeless tobacco: Never Used  . Alcohol Use: Yes  . Drug Use: No  . Sexual Activity: Not on file   Other Topics Concern  . Not on file   Social History Narrative   Patient lives at home with family.   Caffeine Use: 1 tea or coffee daily in a.m     PHYSICAL EXAM  Filed Vitals:   12/28/14 1351  BP: 100/64  Pulse: 72  Height: 5\' 4"  (1.626 m)  Weight: 109 lb 3.2 oz (49.533 kg)    Not recorded     Wt Readings from Last 3 Encounters:  12/28/14 109 lb 3.2 oz (49.533 kg)  11/03/13 118 lb (53.524 kg)  10/14/13 120 lb 8 oz (54.658 kg)   Body mass index is 18.73 kg/(m^2).   GENERAL EXAM/CONSTITUTIONAL:  Vitals: as above  Patient is in no distress; well developed, nourished and groomed; neck is  supple  CARDIOVASCULAR:  Examination of carotid arteries is normal; no carotid bruits  Regular rate and rhythm, no murmurs  Examination of peripheral vascular system by observation and palpation is normal  EYES:  Ophthalmoscopic exam of optic discs and posterior segments is normal; no papilledema or hemorrhages  MUSCULOSKELETAL:  Gait, strength, tone, movements noted in Neurologic exam below  NEUROLOGIC: MENTAL STATUS:   awake, alert  recent and remote memory intact  normal attention and concentration  language fluent, comprehension intact, naming intact,   fund of knowledge appropriate  CRANIAL NERVE:   2nd, 3rd, 4th, 6th - pupils equal and reactive to light, visual fields full to confrontation, extraocular muscles intact, no nystagmus  5th - facial sensation symmetric  7th - facial strength symmetric  8th - hearing intact  9th - palate elevates symmetrically, uvula midline  11th - shoulder shrug symmetric  12th - tongue protrusion midline  MOTOR:   normal bulk and tone, full strength in the BUE, BLE  SENSORY:   normal and symmetric to light touch, pinprick, temperature, vibration  COORDINATION:   finger-nose-finger, fine finger movements normal  REFLEXES:   deep tendon reflexes present and symmetric  GAIT/STATION:   narrow based gait; able to walk tandem; romberg is negative    DIAGNOSTIC DATA (LABS, IMAGING, TESTING) - I reviewed patient records, labs, notes, testing and imaging myself where available.  No results found for: WBC No results found for: NA No results found for: CHOL No results found for: HGBA1C No results found for: VITAMINB12 No results found for: TSH   11/16/11 - 11/17/11 VEEG ([facility], report) - left anterior temporal spike, slow wave discharges and intermittent focal slowing over left temporal region.    ASSESSMENT AND PLAN  34 y.o. year old female here with left temporal lobe epilepsy, with 2 generalized  convulsive seizures in 2012 (01/04/11 and 03/20/11), but none since starting anti-seizure medications. However, now having intermittent partial seizures (1-2 aphasia spells per day, 30-60 seconds at a time) without motor symptoms or alteration of consciousness. Was doing well on LEV 250 / 500 + LTG 100 BID, but then had side effects from LTG (anxiety / mood / disturbing mental images).   Now doing better on vimpat, zonisamide and sugar/carb restriction.  Last partial seizure 3 weeks ago (no LOC, no convulsions).   PLAN: I spent 25 minutes of face to face time with patient. Greater than 50% of time was spent in counseling and coordination of care with patient. In summary we discussed:  - continue vimpat + zonisamide - ok to limit excessive simple carbs/sugars from diet - ok to drive from my standpoint (no convulsive seizures since 2012; she is compliant with medications; current spells/seizures are partial only, aphasia only, and do not affect her ability to drive)  Meds ordered this encounter  Medications  . Lacosamide (VIMPAT) 100 MG TABS    Sig: Take 1 tablet (100 mg total) by mouth 2 (two) times daily.    Dispense:  60 tablet    Refill:  5  . zonisamide (ZONEGRAN) 100 MG capsule    Sig: Take 2 capsules (200 mg total) by mouth at bedtime.    Dispense:  60 capsule    Refill:  12   Return in about 6 months (around 06/29/2015).    Penni Bombard, MD 4/82/7078, 6:75 PM Certified in Neurology, Neurophysiology and Neuroimaging  [facility] [address] [place] [address] [place], [place] [address] [phone]

## 2014-12-29 DIAGNOSIS — Z0289 Encounter for other administrative examinations: Secondary | ICD-10-CM

## 2015-02-21 ENCOUNTER — Emergency Department (HOSPITAL_COMMUNITY): Payer: BLUE CROSS/BLUE SHIELD

## 2015-02-21 ENCOUNTER — Emergency Department (HOSPITAL_COMMUNITY)
Admission: EM | Admit: 2015-02-21 | Discharge: 2015-02-21 | Disposition: A | Discharge status: Home / Self Care | Payer: BLUE CROSS/BLUE SHIELD | Attending: Emergency Medicine | Admitting: Emergency Medicine

## 2015-02-21 ENCOUNTER — Encounter (HOSPITAL_COMMUNITY): Payer: Self-pay | Admitting: *Deleted

## 2015-02-21 DIAGNOSIS — Z3202 Encounter for pregnancy test, result negative: Secondary | ICD-10-CM | POA: Insufficient documentation

## 2015-02-21 DIAGNOSIS — R109 Unspecified abdominal pain: Secondary | ICD-10-CM

## 2015-02-21 DIAGNOSIS — Z8719 Personal history of other diseases of the digestive system: Secondary | ICD-10-CM | POA: Insufficient documentation

## 2015-02-21 DIAGNOSIS — N200 Calculus of kidney: Secondary | ICD-10-CM | POA: Insufficient documentation

## 2015-02-21 DIAGNOSIS — Z79899 Other long term (current) drug therapy: Secondary | ICD-10-CM | POA: Diagnosis not present

## 2015-02-21 LAB — URINALYSIS, ROUTINE W REFLEX MICROSCOPIC
BILIRUBIN URINE: NEGATIVE
Glucose, UA: NEGATIVE mg/dL
Hgb urine dipstick: NEGATIVE
Ketones, ur: NEGATIVE mg/dL
Leukocytes, UA: NEGATIVE
NITRITE: NEGATIVE
Protein, ur: NEGATIVE mg/dL
Specific Gravity, Urine: 1.022 (ref 1.005–1.030)
Urobilinogen, UA: 0.2 mg/dL (ref 0.0–1.0)
pH: 5 (ref 5.0–8.0)

## 2015-02-21 LAB — BASIC METABOLIC PANEL
ANION GAP: 10 (ref 5–15)
BUN: 16 mg/dL (ref 6–20)
CO2: 22 mmol/L (ref 22–32)
Calcium: 9.2 mg/dL (ref 8.9–10.3)
Chloride: 108 mmol/L (ref 101–111)
Creatinine, Ser: 0.94 mg/dL (ref 0.44–1.00)
GFR calc non Af Amer: >60 mL/min (ref >60)
GLUCOSE: 150 mg/dL — AB (ref 65–99)
POTASSIUM: 3.4 mmol/L — AB (ref 3.5–5.1)
SODIUM: 140 mmol/L (ref 135–145)

## 2015-02-21 LAB — CBC
HCT: 41.1 % (ref 36.0–46.0)
Hemoglobin: 14 g/dL (ref 12.0–15.0)
MCH: 32.2 pg (ref 26.0–34.0)
MCHC: 34.1 g/dL (ref 30.0–36.0)
MCV: 94.5 fL (ref 78.0–100.0)
Platelets: 313 10*3/uL (ref 150–400)
RBC: 4.35 MIL/uL (ref 3.87–5.11)
RDW: 12.1 % (ref 11.5–15.5)
WBC: 13.8 10*3/uL — ABNORMAL HIGH (ref 4.0–10.5)

## 2015-02-21 LAB — POC URINE PREG, ED: PREG TEST UR: NEGATIVE

## 2015-02-21 MED ORDER — SODIUM CHLORIDE 0.9 % IV BOLUS (SEPSIS)
1000.0000 mL | Freq: Once | INTRAVENOUS | Status: AC
  Administered 2015-02-21: 1000 mL via INTRAVENOUS

## 2015-02-21 MED ORDER — SODIUM CHLORIDE 0.9 % IV BOLUS (SEPSIS)
500.0000 mL | Freq: Once | INTRAVENOUS | Status: AC
  Administered 2015-02-21: 500 mL via INTRAVENOUS

## 2015-02-21 MED ORDER — ONDANSETRON HCL 4 MG/2ML IJ SOLN
4.0000 mg | Freq: Once | INTRAMUSCULAR | Status: AC
  Administered 2015-02-21: 4 mg via INTRAVENOUS
  Filled 2015-02-21: qty 2

## 2015-02-21 MED ORDER — OXYCODONE-ACETAMINOPHEN 5-325 MG PO TABS: 1.0000 | ORAL_TABLET | ORAL | Status: DC | PRN

## 2015-02-21 MED ORDER — KETOROLAC TROMETHAMINE 30 MG/ML IJ SOLN
30.0000 mg | Freq: Once | INTRAMUSCULAR | Status: AC
  Administered 2015-02-21: 30 mg via INTRAVENOUS
  Filled 2015-02-21: qty 1

## 2015-02-21 MED ORDER — SODIUM CHLORIDE 0.9 % IV BOLUS (SEPSIS): 1000.0000 mL | Freq: Once | INTRAVENOUS | Status: DC

## 2015-02-21 MED ORDER — ONDANSETRON HCL 4 MG PO TABS: 4.0000 mg | ORAL_TABLET | Freq: Three times a day (TID) | ORAL | Status: DC | PRN

## 2015-02-21 MED ORDER — MORPHINE SULFATE 4 MG/ML IJ SOLN
4.0000 mg | Freq: Once | INTRAMUSCULAR | Status: AC
  Administered 2015-02-21: 4 mg via INTRAVENOUS
  Filled 2015-02-21: qty 1

## 2015-02-21 MED ORDER — HYDROMORPHONE HCL 1 MG/ML IJ SOLN
1.0000 mg | Freq: Once | INTRAMUSCULAR | Status: AC
  Administered 2015-02-21: 1 mg via INTRAVENOUS
  Filled 2015-02-21: qty 1

## 2015-02-21 NOTE — ED Provider Notes (Signed)
CSN: [id number]     Arrival date & time 02/21/15  4332 History   First MD Initiated Contact with Patient 02/21/15 1009     Chief Complaint  Patient presents with  . Flank Pain     (Consider location/radiation/quality/duration/timing/severity/associated sxs/prior Treatment) HPI  Patient is a 34 year old female with past medical history of seizures, acid reflux who presents to the ER with sudden onset left flank pain, described as a squeezing and stabbing, rated 10 out of 10, that is constant and radiates from her left flank around to her left abdomen, into her "bladder" and down into her groin. It is associated with nausea, vomiting and lower urine output this morning.  She did not have any symptoms prior to the onset this morning at 7:30 AM.  She denies any fever, chills, dysuria, hematuria, vaginal symptoms such as discharge, dyspareunia, itching or burning.  She has not had any bowel changes such as diarrhea or constipation. She denies having any current acid reflux symptoms. Her last menstrual period began 6 days ago.  She has a history of C-section but no other abdominal surgeries.   Past Medical History  Diagnosis Date  . Seizures   . Acid reflux    Past Surgical History  Procedure Laterality Date  . Cesarean section    . Hysteroscopy  08/2011   Family History  Problem Relation Age of Onset  . Breast cancer Mother    History  Substance Use Topics  . Smoking status: Never Smoker   . Smokeless tobacco: Never Used  . Alcohol Use: Yes   OB History    No data available     Review of Systems  Constitutional: Negative.   HENT: Negative.   Eyes: Negative.   Respiratory: Negative.   Cardiovascular: Negative.   Gastrointestinal: Negative for diarrhea, constipation, blood in stool, abdominal distention, anal bleeding and rectal pain.  Endocrine: Negative.   Genitourinary: Positive for flank pain and decreased urine volume. Negative for dysuria, urgency, frequency, hematuria,  vaginal bleeding, vaginal discharge, vaginal pain, menstrual problem, pelvic pain and dyspareunia.  Musculoskeletal: Negative.   Skin: Negative.   Neurological: Negative.   Psychiatric/Behavioral: Negative.       Allergies  Review of patient's allergies indicates no known allergies.  Home Medications   Prior to Admission medications   Medication Sig Start Date End Date Taking? Authorizing Provider  BIOTIN PO Take by mouth.   Yes Historical Provider, MD  Lacosamide (VIMPAT) 100 MG TABS Take 1 tablet (100 mg total) by mouth 2 (two) times daily. 12/28/14  Yes Penni Bombard, MD  Multiple Vitamins-Minerals (WOMENS MULTIVITAMIN PLUS PO) Take 1 tablet by mouth daily.   Yes Historical Provider, MD  zonisamide (ZONEGRAN) 100 MG capsule Take 2 capsules (200 mg total) by mouth at bedtime. 12/28/14 12/28/15 Yes Vikram R Penumalli, MD  ondansetron (ZOFRAN) 4 MG tablet Take 1 tablet (4 mg total) by mouth every 8 (eight) hours as needed for nausea or vomiting. 02/21/15   Delsa Grana, PA-C  oxyCODONE-acetaminophen (PERCOCET) 5-325 MG per tablet Take 1-2 tablets by mouth every 4 (four) hours as needed for severe pain. 02/21/15   Delsa Grana, PA-C   BP 92/51 mmHg  Pulse 70  Temp(Src) 97.7 F (36.5 C) (Oral)  Resp 18  SpO2 100%  LMP 02/15/2015 Physical Exam  Constitutional: She is oriented to person, place, and time. She appears well-developed and well-nourished.  Thin female, appears stated age, appears extremely uncomfortable, writhing around in ER gurney, and curling into  a ball, patient is tearful, distressed, but appears nontoxic  HENT:  Head: Normocephalic and atraumatic.  Nose: Nose normal.  Mouth/Throat: Oropharynx is clear and moist. No oropharyngeal exudate.  Eyes: Conjunctivae and EOM are normal. Pupils are equal, round, and reactive to light. Right eye exhibits no discharge. Left eye exhibits no discharge. No scleral icterus.  Neck: Normal range of motion. No JVD present. No tracheal  deviation present. No thyromegaly present.  Cardiovascular: Normal rate, regular rhythm, normal heart sounds and intact distal pulses.  Exam reveals no gallop and no friction rub.   No murmur heard. Pulmonary/Chest: Effort normal and breath sounds normal. No respiratory distress. She has no wheezes. She has no rales. She exhibits no tenderness.  Abdominal: Soft. Bowel sounds are normal. She exhibits no distension and no mass. There is tenderness. There is no rebound and no guarding.  Abdomen appears normal, soft and thin, with normal bowel sounds 4 enderness to palpation left side of abdomen, without guarding or rebound, left CVA tenderness  Musculoskeletal: Normal range of motion. She exhibits no edema or tenderness.  Lymphadenopathy:    She has no cervical adenopathy.  Neurological: She is alert and oriented to person, place, and time. She has normal reflexes. No cranial nerve deficit. She exhibits normal muscle tone. Coordination normal.  Skin: Skin is warm and dry. No rash noted. She is not diaphoretic. No erythema. No pallor.  Psychiatric: She has a normal mood and affect. Her behavior is normal. Judgment and thought content normal.  Nursing note and vitals reviewed.      ED Course  Procedures (including critical care time) Labs Review Labs Reviewed  BASIC METABOLIC PANEL - Abnormal; Notable for the following:    Potassium 3.4 (*)    Glucose, Bld 150 (*)    All other components within normal limits  CBC - Abnormal; Notable for the following:    WBC 13.8 (*)    All other components within normal limits  URINALYSIS, ROUTINE W REFLEX MICROSCOPIC (NOT AT [facility])  POC URINE PREG, ED    Imaging Review Ct Renal Stone Study  02/21/2015   CLINICAL DATA:  Left flank and bladder pain and low back and lower abdominal pain. Nausea and vomiting.  EXAM: CT ABDOMEN AND PELVIS WITHOUT CONTRAST  TECHNIQUE: Multidetector CT imaging of the abdomen and pelvis was performed following the standard  protocol without IV contrast.  COMPARISON:  None.  FINDINGS: Lower chest:  Normal.  Hepatobiliary: Normal.  Pancreas: Normal.  Spleen: Normal.  Adrenals/Urinary Tract: There is a 2.5 mm stone in the right posterior lateral aspect of the bladder. There are no renal or ureteral calculi. No hydronephrosis.  Stomach/Bowel: Normal.  Vascular/Lymphatic: Normal.  Reproductive: There is a 3 cm fat containing lesion in the left adnexa consistent with a left ovarian dermoid. Uterus is normal. Right ovary is normal.  Other: No free air or free fluid in the abdomen.  Musculoskeletal: Normal.  IMPRESSION: 1. 2.5 mm stone in the right posterior lateral aspect of the bladder. This could represent a recently passed ureteral stone. 2. 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   Electronically Signed   By: Lorriane Shire M.D.   On: 02/21/2015 12:32     EKG Interpretation None      MDM   Final diagnoses:  Left flank pain  Nephrolithiasis    Pt w/ left flank pain, sudden onset - suspicious for kidney stone, will r/o UTI/pyelo  Patient's pain was controlled with multiple doses  of narcotic pain medicine and Toradol She did have multiple episodes of emesis in the ER, which have resolved with IV Zofran and pain medicines CT scan reveals a 2.5 mm stone in the bladder, pt was likely in the process of passing the stone while here in the ED  Pt has been afebrile, w/o tachycardia, BP soft after pain meds, she already had low systolic to the 05W and 979Y. She was given an additional 500 mL bolus, is currently eating and drinking.    She'll be discharged with pain medicine, a hat to pee in to collect stone in order to follow-up with her PCP  I discussed the finding of a dermoid cyst in her left ovary, and she has expressed that she will follow-up with OB/GYN.  Currently her pain has resolved, no pelvic pain no vaginal symptoms  Vital reviewed. Filed Vitals:   02/21/15 1130 02/21/15 1145 02/21/15 1230 02/21/15 1245  BP:  110/69 97/65 89/50  92/51  Pulse: 79 76 70 70  Temp:      TempSrc:      Resp:      SpO2: 100% 100% 100% 100%   Return precautions were given to the patient who verbalizes understanding.  Pt discharged home in satisfactory condition.    Delsa Grana, PA-C 02/21/15 1337  Leo Grosser, MD 02/21/15 [phone]

## 2015-02-21 NOTE — ED Notes (Signed)
Patient transported to CT 

## 2015-02-21 NOTE — ED Provider Notes (Signed)
Medical screening examination/treatment/procedure(s) were conducted as a shared visit with non-physician practitioner(s) and myself.  I personally evaluated the patient during the encounter.   EKG Interpretation None     34 year old female presenting with left flank pain that is new onset suggestive of acute nephrolithiasis. Writhing in bed clutching her left side on exam with borderline low blood pressure. Fluid boluses administered, pain meds, scan for signs of complication. Stone is in bladder on scan, patient's pain relieved with parenteral analgesia, plan for routine follow-up. Monitored in the ED for mild hypertension status post pain medication administration and is asymptomatic, not orthostatic, appropriate for discharge.  See related encounter note   Leo Grosser, MD 02/21/15 1426

## 2015-02-21 NOTE — ED Notes (Signed)
Pt woke up with pain in bladder and now with left flank pain, lower back and lower abdominal pain.  Vomited

## 2015-02-21 NOTE — Discharge Instructions (Signed)

## 2015-07-07 ENCOUNTER — Ambulatory Visit (INDEPENDENT_AMBULATORY_CARE_PROVIDER_SITE_OTHER): Payer: BLUE CROSS/BLUE SHIELD | Admitting: Diagnostic Neuroimaging

## 2015-07-07 ENCOUNTER — Encounter: Payer: Self-pay | Admitting: Diagnostic Neuroimaging

## 2015-07-07 VITALS — BP 94/69 | HR 68 | Ht 64.0 in | Wt 106.8 lb

## 2015-07-07 DIAGNOSIS — G40109 Localization-related (focal) (partial) symptomatic epilepsy and epileptic syndromes with simple partial seizures, not intractable, without status epilepticus: Secondary | ICD-10-CM

## 2015-07-07 MED ORDER — LACOSAMIDE 150 MG PO TABS: 150.0000 mg | ORAL_TABLET | Freq: Two times a day (BID) | ORAL | Status: DC

## 2015-07-07 MED ORDER — ZONISAMIDE 100 MG PO CAPS: 100.0000 mg | ORAL_CAPSULE | Freq: Every day | ORAL | Status: DC

## 2015-07-07 NOTE — Progress Notes (Signed)
[facility]  PATIENT: Beth Vasquez DOB: 14-Jan-1981  REFERRING CLINICIAN:  HISTORY FROM: patient  REASON FOR VISIT: follow up    HISTORICAL  CHIEF COMPLAINT:  Chief Complaint  Patient presents with  . Follow-up    Localization-related epilepsy     HISTORY OF PRESENT ILLNESS:   UPDATE 07/07/15: Since last visit, still with small clusters of aphasia events (3-4 events in a day; once per month). Overall stable. Had kidney stone (severe) August 2016, went to ER. No recurrent stones.   UPDATE 12/28/14: Since last visit, doing much better. Went to [facility] for second opinion, continued vimpat, and then LEV was switched to [facility]. A few months ago, tried cutting out sugars/carb to lose some weight, and noticed that her seizures almost stopped. Now occ if she has sugars for 2-3 days, then she may have a few breakthrough partial seizures (aphasia x 20 sec).   UPDATE 11/03/13: Since last visit, continued on LTG 100mg  BID, but started having anxiety attacks and "disturbing" images in her mind (knife etc). She was advised to taper off LTG by my colleagues in my absence. Since that time, she has come down to LTG 25mg  BID, and the anxiety / images have subsided. Now her aphasia / partial seizures are back up to 2 per day. No grand mal seizures since 2012.  UPDATE 10/14/13: Since last visit, tried vimpat but was too expensive. Now on LTG 100mg  BID, and doing better. At LTG 75mg  BID, was event free x 2 weeks. Now on LTG 100mg  BID x 2 weeks, and only had 1 event yesterday. Events are consistent with 30 seconds of exp and receptive aphasia. No confusion, convulsions or staring. Able to walk, move and function. She is awake/aware when these are occuring.   UPDATE 07/22/13 (LL): Patient returns for earlier revisit due to increased partial seizures. She has almost daily episodes of being unable to speak and staring, but knows what is going on around her. Other times she gets an aura of a seizure,  with a deja vu feeling, but then it never happens. Sometimes this is followed by headache and tiredness. Her dose of Keppra is 250 mg in the morning and 500 mg at night. At higher dose she states she had depression, worse ringing in the ears and hair falling out. Her last EEG was in [place] and was video-monitored. She thinks her last MRI was 2 years ago.   PRIOR HPI 02/04/13 (VP): 34 year old right-handed female here for evaluation of seizure disorder. Patient was born full-term, no complications with normal development. Family history seizure only notable in patient's mother's uncle. Patient has had 2 episodes of seizure in her life on 01/04/11 and 03/20/11. First seizure, patient was in the car with her husband and suddenly became unresponsive without warning. She has generalized convulsions. Took her about 20 minutes to regain consciousness. She was slowing confused for several hours. She bit her tongue but did not have incontinence. Second episode occurred in a similar fashion to the first. Following the second seizure patient started on levetiracetam 500 mg twice a day. Within a few months, patient was complaining of depression and being in the ears as well as hair loss and therefore levetiracetam dose was decreased to 500 mg the morning and 500 mg at night. Around this time patient began to have intermittent episodes lasting 1 minute at a time, of expressive and receptive aphasia. She did not lose consciousness. She is able to stand up sit down involuntary move  her arms and legs. Sometimes she is able to get one or 2 words out. These events occur once per week, in clusters of up to 5 in a day.  Patient had video EEG monitoring in [place], which she thinks showed left frontal epileptiform discharges. Patient was pregnant in 2013, and stayed on levetiracetam during the pregnancy. She had no seizures during pregnancy. She gave birth to 2 healthy twin girls, who are now 65 months old. Patient reports that she has  not had significant seizures in about two years. Recently, she has been having seizures and would like to be assessed and medication changes made as appropriate.   REVIEW OF SYSTEMS: Full 14 system review of systems performed and notable only for only as per history of present illness otherwise negative.  ALLERGIES: No Known Allergies  HOME MEDICATIONS: Outpatient Prescriptions Prior to Visit  Medication Sig Dispense Refill  . BIOTIN PO Take by mouth.    . Lacosamide (VIMPAT) 100 MG TABS Take 1 tablet (100 mg total) by mouth 2 (two) times daily. 60 tablet 5  . Multiple Vitamins-Minerals (WOMENS MULTIVITAMIN PLUS PO) Take 1 tablet by mouth daily.    Marland Kitchen zonisamide (ZONEGRAN) 100 MG capsule Take 2 capsules (200 mg total) by mouth at bedtime. 60 capsule 12  . ondansetron (ZOFRAN) 4 MG tablet Take 1 tablet (4 mg total) by mouth every 8 (eight) hours as needed for nausea or vomiting. (Patient not taking: Reported on 07/07/2015) 10 tablet 0  . oxyCODONE-acetaminophen (PERCOCET) 5-325 MG per tablet Take 1-2 tablets by mouth every 4 (four) hours as needed for severe pain. (Patient not taking: Reported on 07/07/2015) 10 tablet 0   No facility-administered medications prior to visit.    PAST MEDICAL HISTORY: Past Medical History  Diagnosis Date  . Seizures ([place])   . Acid reflux     PAST SURGICAL HISTORY: Past Surgical History  Procedure Laterality Date  . Cesarean section    . Hysteroscopy  08/2011    FAMILY HISTORY: Family History  Problem Relation Age of Onset  . Breast cancer Mother   . Seizures Maternal Uncle     SOCIAL HISTORY:  Social History   Social History  . Marital Status: Married    Spouse Name: [name]  . Number of Children: 2  . Years of Education: College   Occupational History  . N/A    Social History Main Topics  . Smoking status: Never Smoker   . Smokeless tobacco: Never Used  . Alcohol Use: 0.6 oz/week    1 Glasses of wine per week     Comment:  occasionally  . Drug Use: No  . Sexual Activity: Not on file   Other Topics Concern  . Not on file   Social History Narrative   Patient lives at home with family.   Caffeine Use: 1 tea or coffee daily in a.m     PHYSICAL EXAM  Filed Vitals:   07/07/15 1415  BP: 94/69  Pulse: 68  Height: 5\' 4"  (1.626 m)  Weight: 106 lb 12.8 oz (48.444 kg)    Not recorded     Wt Readings from Last 3 Encounters:  07/07/15 106 lb 12.8 oz (48.444 kg)  12/28/14 109 lb 3.2 oz (49.533 kg)  11/03/13 118 lb (53.524 kg)   Body mass index is 18.32 kg/(m^2).   GENERAL EXAM/CONSTITUTIONAL:  Vitals: as above  Patient is in no distress; well developed, nourished and groomed; neck is supple  CARDIOVASCULAR:  Examination of carotid  arteries is normal; no carotid bruits  Regular rate and rhythm, no murmurs  Examination of peripheral vascular system by observation and palpation is normal   MUSCULOSKELETAL:  Gait, strength, tone, movements noted in Neurologic exam below  NEUROLOGIC: MENTAL STATUS:   awake, alert  recent and remote memory intact  normal attention and concentration  language fluent, comprehension intact, naming intact,   fund of knowledge appropriate  CRANIAL NERVE:   2nd, 3rd, 4th, 6th - pupils equal and reactive to light, visual fields full to confrontation, extraocular muscles intact, no nystagmus  5th - facial sensation symmetric  7th - facial strength symmetric  8th - hearing intact  9th - palate elevates symmetrically, uvula midline  11th - shoulder shrug symmetric  12th - tongue protrusion midline  MOTOR:   normal bulk and tone, full strength in the BUE, BLE  SENSORY:   normal and symmetric to light touch, temperature, vibration  COORDINATION:   finger-nose-finger, fine finger movements normal  REFLEXES:   deep tendon reflexes present and symmetric  GAIT/STATION:   narrow based gait; able to walk tandem; romberg is  negative    DIAGNOSTIC DATA (LABS, IMAGING, TESTING) - I reviewed patient records, labs, notes, testing and imaging myself where available.  Lab Results  Component Value Date   WBC 13.8* 02/21/2015      Component Value Date/Time   NA 140 02/21/2015 1030   No results found for: CHOL No results found for: HGBA1C No results found for: VITAMINB12 No results found for: TSH   11/16/11 - 11/17/11 VEEG ([facility] [facility], report) - left anterior temporal spike, slow wave discharges and intermittent focal slowing over left temporal region.    ASSESSMENT AND PLAN  34 y.o. 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 However, now having intermittent partial seizures (1-2 aphasia spells per day, 30-60 seconds at a time) without motor symptoms or alteration of consciousness. Was doing well on LEV 250 / 500 + LTG 100 BID, but then had side effects from LTG (anxiety / mood / disturbing mental images).   Was doing better on vimpat, zonisamide and sugar/carb restriction. Then had kidney stone in August 2016.   PLAN: - increase vimpat to 150mg  BID - reduce zonisamide to 100mg  daily (due to kidney stone, weight loss, cognitive side effects) - ok to limit excessive simple carbs/sugars from diet; may need to see PCP or nutritionist for 20lb weight loss (likely due to calorie restrtiction) - ok to drive from my standpoint (no convulsive seizures since 2012; she is compliant with medications; current spells/seizures are partial only, aphasia only, and do not affect her ability to drive)  Meds ordered this encounter  Medications  . zonisamide (ZONEGRAN) 100 MG capsule    Sig: Take 1 capsule (100 mg total) by mouth at bedtime.    Dispense:  60 capsule    Refill:  12  . Lacosamide (VIMPAT) 150 MG TABS    Sig: Take 1 tablet (150 mg total) by mouth 2 (two) times daily.    Dispense:   60 tablet    Refill:  5   Return in about 3 months (around 10/05/2015).    Penni Bombard, MD [id number], [id number] PM Certified in Neurology, Neurophysiology and Neuroimaging  [facility] [address], [place], [place] [address] [phone]

## 2015-07-07 NOTE — Patient Instructions (Addendum)
Thank you for coming to see [place] at [facility]. I hope we have been able to provide you high quality care today.  You may receive a patient satisfaction survey over the next few weeks. We would appreciate your feedback and comments so that we may continue to improve ourselves and the health of our patients.  - increase vimpat to '150mg'$  twice a day - reduce zonisamide to '100mg'$  at bedtime   ~~~~~~~~~~~~~~~~~~~~~~~~~~~~~~~~~~~~~~~~~~~~~~~~~~~~~~~~~~~~~~~~~  DR. Jamisha Hoeschen'S GUIDE TO HAPPY AND HEALTHY LIVING These are some of my general health and wellness recommendations. Some of them may apply to you better than others. Please use common sense as you try these suggestions and feel free to ask me any questions.   ACTIVITY/FITNESS Mental, social, emotional and physical stimulation are very important for brain and body health. Try learning a new activity (arts, music, language, sports, games).  Keep moving your body to the best of your abilities. You can do this at home, inside or outside, the park, community center, gym or anywhere you like. Consider a physical therapist or personal trainer to get started. Consider the app Sworkit. Fitness trackers such as smart-watches, smart-phones or Fitbits can help as well.   NUTRITION Eat more plants: colorful vegetables, nuts, seeds and berries.  Eat less sugar, salt, preservatives and processed foods.  Avoid toxins such as cigarettes and alcohol.  Drink water when you are thirsty. Warm water with a slice of lemon is an excellent morning drink to start the day.  Consider these websites for more information The Nutrition Source (https://www.henry-hernandez.biz/) Precision Nutrition (WindowBlog.ch)   RELAXATION Consider practicing mindfulness meditation or other relaxation techniques such as deep breathing, prayer, yoga, tai chi, massage. See website mindful.org or the apps Headspace or  Calm to help get started.   SLEEP Try to get at least 7-8+ hours sleep per day. Regular exercise and reduced caffeine will help you sleep better. Practice good sleep hygeine techniques. See website sleep.org for more information.   PLANNING Prepare estate planning, living will, healthcare POA documents. Sometimes this is best planned with the help of an attorney. Theconversationproject.org and agingwithdignity.org are excellent resources.

## 2015-08-15 ENCOUNTER — Other Ambulatory Visit: Payer: Self-pay | Admitting: Diagnostic Neuroimaging

## 2015-08-15 MED ORDER — LACOSAMIDE 100 MG PO TABS: 150.0000 mg | ORAL_TABLET | Freq: Two times a day (BID) | ORAL | Status: DC

## 2015-08-15 NOTE — Telephone Encounter (Signed)
I called the pharmacy to clarify.  Pharmacist said they are unable to get 150mg  tabs in stock at this time.  She said they are able to dispense 100mg  tabs, with 150mg  instructions, however, would not take a verbal auth for this change, and requested a new Rx be written and faxed.  Request sent to provider for approval/signature.   I called the patient back.  Got no answer.  Left message.

## 2015-08-15 NOTE — Telephone Encounter (Signed)
Pt called and says that her pharmacy can not fill Lacosamide (VIMPAT) 150 MG TABS. They are telling her it is due to manufactures not making the 150 mg dosage any more. Pt has enough for this morning but does not have enough for tonight. Can a new rx be sent in? Please call pt at [phone]

## 2015-10-19 ENCOUNTER — Ambulatory Visit: Payer: BLUE CROSS/BLUE SHIELD | Admitting: Diagnostic Neuroimaging

## 2015-10-19 ENCOUNTER — Encounter: Payer: Self-pay | Admitting: Diagnostic Neuroimaging

## 2015-11-09 ENCOUNTER — Telehealth: Payer: Self-pay | Admitting: Diagnostic Neuroimaging

## 2015-11-09 NOTE — Telephone Encounter (Signed)
Patient called, will be leaving on a cruise Sunday, it's been a long time since she has been on a cruise, last time was prior to epilepsy diagnosis. Wants to know what would be the best sea sick medicine for her to take?

## 2015-11-09 NOTE — Telephone Encounter (Signed)
LMOM (identified vm) that pt. should check with her pcp for med for sea sickness/fim

## 2016-02-06 ENCOUNTER — Other Ambulatory Visit: Payer: Self-pay | Admitting: Diagnostic Neuroimaging

## 2016-02-07 NOTE — Telephone Encounter (Signed)
Vimpat prescription at front desk for pick up.

## 2016-02-07 NOTE — Telephone Encounter (Signed)
Patient is calling back and states that she does need the 1 month supply of Vimpat as she is leaving for [place] tomorrow.  Could she pickup up today up front.  Please call [phone].  Thanks!

## 2016-02-07 NOTE — Telephone Encounter (Signed)
LVM for patient informing her that she will need to pick up paper prescription for Vimpat refill. Also advised she will need to schedule a follow up, did not come in for 10/19/15 follow up. Informed her the medication will be refilled to cover her until her follow up.  Left name, number and requested she call back. Called pt back, LVM explaining that she needs to schedule follow up when she comes in to pick up prescription. Advised she call before coming to be sure prescription is ready. Left name, number.

## 2016-02-15 ENCOUNTER — Ambulatory Visit (INDEPENDENT_AMBULATORY_CARE_PROVIDER_SITE_OTHER): Payer: BLUE CROSS/BLUE SHIELD | Admitting: Diagnostic Neuroimaging

## 2016-02-15 ENCOUNTER — Encounter: Payer: Self-pay | Admitting: Diagnostic Neuroimaging

## 2016-02-15 VITALS — BP 100/72 | HR 89 | Wt 108.0 lb

## 2016-02-15 DIAGNOSIS — H538 Other visual disturbances: Secondary | ICD-10-CM | POA: Diagnosis not present

## 2016-02-15 DIAGNOSIS — G40109 Localization-related (focal) (partial) symptomatic epilepsy and epileptic syndromes with simple partial seizures, not intractable, without status epilepticus: Secondary | ICD-10-CM

## 2016-02-15 MED ORDER — LACOSAMIDE 150 MG PO TABS: 150.0000 mg | ORAL_TABLET | Freq: Two times a day (BID) | ORAL | 5 refills | Status: DC

## 2016-02-15 MED ORDER — LACOSAMIDE 100 MG PO TABS: 150.0000 mg | ORAL_TABLET | Freq: Two times a day (BID) | ORAL | 5 refills | Status: DC

## 2016-02-15 MED ORDER — ZONISAMIDE 100 MG PO CAPS: 100.0000 mg | ORAL_CAPSULE | Freq: Every day | ORAL | 4 refills | Status: DC

## 2016-02-15 NOTE — Progress Notes (Signed)
[facility]  PATIENT: Beth Vasquez DOB: 1981/04/24  REFERRING CLINICIAN:  HISTORY FROM: patient  REASON FOR VISIT: follow up    HISTORICAL  CHIEF COMPLAINT:  Chief Complaint  Patient presents with  . Seizures    rm 6, " no seizure activity; hair loss is worrisome; vision in R eye abruptly changed-due for annual vision check up"  . Follow-up    HISTORY OF PRESENT ILLNESS:   UPDATE 02/15/16: Since last visit, doing well. Seizures are less frequent (more than 1 month per event). Hydration, low carbs, rest seem to help avoid seizures. Tolerating meds. No kidney stone issues. Has gained 10lbs which was desirable for patient.   UPDATE 07/07/15: Since last visit, still with small clusters of aphasia events (3-4 events in a day; once per month). Overall stable. Had kidney stone (severe) August 2016, went to ER. No recurrent stones.   UPDATE 12/28/14: Since last visit, doing much better. Went to [facility] for second opinion, continued vimpat, and then LEV was switched to [facility]. A few months ago, tried cutting out sugars/carb to lose some weight, and noticed that her seizures almost stopped. Now occ if she has sugars for 2-3 days, then she may have a few breakthrough partial seizures (aphasia x 20 sec).   UPDATE 11/03/13: Since last visit, continued on LTG 100mg  BID, but started having anxiety attacks and "disturbing" images in her mind (knife etc). She was advised to taper off LTG by my colleagues in my absence. Since that time, she has come down to LTG 25mg  BID, and the anxiety / images have subsided. Now her aphasia / partial seizures are back up to 2 per day. No grand mal seizures since 2012.  UPDATE 10/14/13: Since last visit, tried vimpat but was too expensive. Now on LTG 100mg  BID, and doing better. At LTG 75mg  BID, was event free x 2 weeks. Now on LTG 100mg  BID x 2 weeks, and only had 1 event yesterday. Events are consistent with 30 seconds of exp and receptive aphasia. No  confusion, convulsions or staring. Able to walk, move and function. She is awake/aware when these are occuring.   UPDATE 07/22/13 (LL): Patient returns for earlier revisit due to increased partial seizures. She has almost daily episodes of being unable to speak and staring, but knows what is going on around her. Other times she gets an aura of a seizure, with a deja vu feeling, but then it never happens. Sometimes this is followed by headache and tiredness. Her dose of Keppra is 250 mg in the morning and 500 mg at night. At higher dose she states she had depression, worse ringing in the ears and hair falling out. Her last EEG was in [place] and was video-monitored. She thinks her last MRI was 2 years ago.   PRIOR HPI 02/04/13 (VP): 35 year old right-handed femalemale here for evaluation of seizure disorder. Patient was born full-term, no complications with normal development. Family history seizure only notable in patient's mother's uncle. Patient has had 2 episodes of seizure in her life on 01/04/11 and 03/20/11. First seizure, patient was in the car with her husband and suddenly became unresponsive without warning. She has generalized convulsions. Took her about 20 minutes to regain consciousness. She was slowing confused for several hours. She bit her tongue but did not have incontinence. Second episode occurred in a similar fashion to the first. Following the second seizure patient started on levetiracetam 500 mg twice a day. Within a few months, patient was complaining  of depression and being in the ears as well as hair loss and therefore levetiracetam dose was decreased to 500 mg the morning and 500 mg at night. Around this time patient began to have intermittent episodes lasting 1 minute at a time, of expressive and receptive aphasia. She did not lose consciousness. She is able to stand up sit down involuntary move her arms and legs. Sometimes she is able to get one or 2 words out. These events occur once per week,  in clusters of up to 5 in a day.  Patient had video EEG monitoring in [place], which she thinks showed left frontal epileptiform discharges. Patient was pregnant in 2013, and stayed on levetiracetam during the pregnancy. She had no seizures during pregnancy. She gave birth to 2 healthy twin girls, who are now 16 months old. Patient reports that she has not had significant seizures in about two years. Recently, she has been having seizures and would like to be assessed and medication changes made as appropriate.   REVIEW OF SYSTEMS: Full 14 system review of systems performed and negative except for as per HPI. Also with new right eye blurred vision x 3-4 weeks.   ALLERGIES: No Known Allergies  HOME MEDICATIONS: Outpatient Medications Prior to Visit  Medication Sig Dispense Refill  . BIOTIN PO Take by mouth.    . clindamycin-benzoyl peroxide (BENZACLIN) gel APP TO FACE QAM  11  . Multiple Vitamins-Minerals (WOMENS MULTIVITAMIN PLUS PO) Take 1 tablet by mouth daily.    Marland Kitchen VIMPAT 100 MG TABS TAKE 1&1/2 TABLETS BY MOUTH TWICE DAILY 90 tablet 0  . zonisamide (ZONEGRAN) 100 MG capsule Take 1 capsule (100 mg total) by mouth at bedtime. 60 capsule 12   No facility-administered medications prior to visit.     PAST MEDICAL HISTORY: Past Medical History:  Diagnosis Date  . Acid reflux   . Kidney stone   . Seizures ([place])     PAST SURGICAL HISTORY: Past Surgical History:  Procedure Laterality Date  . CESAREAN SECTION    . HYSTEROSCOPY  08/2011    FAMILY HISTORY: Family History  Problem Relation Age of Onset  . Breast cancer Mother   . Seizures Maternal Uncle   . Hypertension Brother     SOCIAL HISTORY:  Social History   Social History  . Marital status: Married    Spouse name: [name]  . Number of children: 2  . Years of education: College   Occupational History  . N/A    Social History Main Topics  . Smoking status: Never Smoker  . Smokeless tobacco: Never Used  . Alcohol use  0.6 oz/week    1 Glasses of wine per week     Comment: occasionally  . Drug use: No  . Sexual activity: Not on file   Other Topics Concern  . Not on file   Social History Narrative   Patient lives at home with family.   Caffeine Use: 1 tea or coffee daily in a.m     PHYSICAL EXAM  Vitals:   02/15/16 1418  BP: 100/72  Pulse: 89  Weight: 108 lb (49 kg)    Not recorded     Wt Readings from Last 3 Encounters:  02/15/16 108 lb (49 kg)  07/07/15 106 lb 12.8 oz (48.4 kg)  12/28/14 109 lb 3.2 oz (49.5 kg)   Body mass index is 18.54 kg/m.   GENERAL EXAM/CONSTITUTIONAL:  Vitals: as above  Patient is in no distress; well developed, nourished and  groomed; neck is supple  CARDIOVASCULAR:  Examination of carotid arteries is normal; no carotid bruits  Regular rate and rhythm, no murmurs  Examination of peripheral vascular system by observation and palpation is normal   MUSCULOSKELETAL:  Gait, strength, tone, movements noted in Neurologic exam below  NEUROLOGIC: MENTAL STATUS:   awake, alert  recent and remote memory intact  normal attention and concentration  language fluent, comprehension intact, naming intact,   fund of knowledge appropriate  CRANIAL NERVE:   2nd, 3rd, 4th, 6th - pupils equal and reactive to light, visual fields full to confrontation, extraocular muscles intact, no nystagmus  5th - facial sensation symmetric  7th - facial strength symmetric  8th - hearing intact  9th - palate elevates symmetrically, uvula midline  11th - shoulder shrug symmetric  12th - tongue protrusion midline  MOTOR:   normal bulk and tone, full strength in the BUE, BLE  SENSORY:   normal and symmetric to light touch, temperature, vibration  COORDINATION:   finger-nose-finger, fine finger movements normal  REFLEXES:   deep tendon reflexes present and symmetric  GAIT/STATION:   narrow based gait; able to walk tandem; romberg is  negative    DIAGNOSTIC DATA (LABS, IMAGING, TESTING) - I reviewed patient records, labs, notes, testing and imaging myself where available.  Lab Results  Component Value Date   WBC 13.8 (H) 02/21/2015      Component Value Date/Time   NA 140 02/21/2015 1030   No results found for: CHOL No results found for: HGBA1C No results found for: VITAMINB12 No results found for: TSH   11/16/11 - 11/17/11 VEEG ([facility], report) - left anterior temporal spike, slow wave discharges and intermittent focal slowing over left temporal region.    ASSESSMENT AND PLAN  35 y.o. 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 However, now having intermittent partial seizures (1-2 aphasia spells per day, 30-60 seconds at a time) without motor symptoms or alteration of consciousness. Was doing well on LEV 250 / 500 + LTG 100 BID, but then had side effects from LTG (anxiety / mood / disturbing mental images).   Was doing better on vimpat, zonisamide and sugar/carb restriction. Then had kidney stone in August 2016. Now on higher vimpat and decreased ZNG, and doing better.   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[place])  Blurred vision, right eye     PLAN: - continue vimpat 150mg  BID (will give rx for 100mg  and 150mg  tab size, b/c pharmacy sometimes doe not have 150mg  tabs in stock) - continue zonisamide 100mg  daily (due to kidney stone, weight loss, cognitive side effects) - ok to limit excessive simple carbs/sugars from diet - ok to drive from my standpoint (no convulsive seizures since 2012; she is compliant with medications; current spells/seizures are partial only, aphasia only, and do not affect her ability to drive) - follow up with eye doctor for right eye vision  Meds ordered this encounter  Medications  . Lacosamide (VIMPAT) 100 MG TABS    Sig: Take 1.5 tablets (150 mg  total) by mouth 2 (two) times daily.    Dispense:  90 tablet    Refill:  5  . zonisamide (ZONEGRAN) 100 MG capsule    Sig: Take 1 capsule (100 mg total) by mouth at bedtime.    Dispense:  90 capsule    Refill:  4  . Lacosamide 150 MG TABS  Sig: Take 1 tablet (150 mg total) by mouth 2 (two) times daily.    Dispense:  60 tablet    Refill:  5   No Follow-up on file.    Penni Bombard, MD [id number], [id number] Certified in Neurology, Neurophysiology and Neuroimaging  [facility] [address] [place], [place] [address] [phone]

## 2016-07-10 ENCOUNTER — Other Ambulatory Visit: Payer: Self-pay | Admitting: Diagnostic Neuroimaging

## 2016-08-11 ENCOUNTER — Other Ambulatory Visit: Payer: Self-pay | Admitting: Diagnostic Neuroimaging

## 2016-10-25 ENCOUNTER — Other Ambulatory Visit: Payer: Self-pay | Admitting: *Deleted

## 2016-10-25 MED ORDER — ZONISAMIDE 100 MG PO CAPS: 100.0000 mg | ORAL_CAPSULE | Freq: Every day | ORAL | 3 refills | Status: DC

## 2016-10-25 MED ORDER — LACOSAMIDE 150 MG PO TABS: 1.0000 | ORAL_TABLET | Freq: Two times a day (BID) | ORAL | 3 refills | Status: DC

## 2016-10-25 NOTE — Telephone Encounter (Signed)
Received request for zonegran and vimpat.  Using [facility] now per pt.

## 2016-10-26 NOTE — Telephone Encounter (Signed)
Received fax confirmation for vimpat [facility].

## 2017-02-15 ENCOUNTER — Ambulatory Visit (INDEPENDENT_AMBULATORY_CARE_PROVIDER_SITE_OTHER): Payer: BLUE CROSS/BLUE SHIELD | Admitting: Diagnostic Neuroimaging

## 2017-02-15 ENCOUNTER — Encounter: Payer: Self-pay | Admitting: Diagnostic Neuroimaging

## 2017-02-15 VITALS — BP 96/61 | HR 75 | Ht 64.0 in | Wt 114.4 lb

## 2017-02-15 DIAGNOSIS — G40109 Localization-related (focal) (partial) symptomatic epilepsy and epileptic syndromes with simple partial seizures, not intractable, without status epilepticus: Secondary | ICD-10-CM

## 2017-02-15 MED ORDER — LACOSAMIDE 150 MG PO TABS: 1.0000 | ORAL_TABLET | Freq: Two times a day (BID) | ORAL | 4 refills | Status: DC

## 2017-02-15 MED ORDER — ZONISAMIDE 100 MG PO CAPS: 100.0000 mg | ORAL_CAPSULE | Freq: Every day | ORAL | 4 refills | Status: DC

## 2017-02-15 NOTE — Patient Instructions (Signed)
-   consider other anti-seizure medications: onfi, gabapentin, carbamazepine, oxcarbazepine

## 2017-02-15 NOTE — Progress Notes (Signed)
[facility]  PATIENT: Beth Vasquez DOB: 05-13-1981  REFERRING CLINICIAN:  HISTORY FROM: patient  REASON FOR VISIT: follow up    HISTORICAL  CHIEF COMPLAINT:  Chief Complaint  Patient presents with  . Follow-up    Localization-related epilepsy follow up    HISTORY OF PRESENT ILLNESS:   UPDATE 02/15/17: Since last visit patient doing well. No major seizures. Patient has rare aphasia spells if she increases her carbohydrate intake or is not get proper rest. Patient has noticed more hair loss in the last 1 year.  UPDATE 02/15/16: Since last visit, doing well. Seizures are less frequent (more than 1 month per event). Hydration, low carbs, rest seem to help avoid seizures. Tolerating meds. No kidney stone issues. Has gained 10lbs which was desirable for patient.   UPDATE 07/07/15: Since last visit, still with small clusters of aphasia events (3-4 events in a day; once per month). Overall stable. Had kidney stone (severe) August 2016, went to ER. No recurrent stones.   UPDATE 12/28/14: Since last visit, doing much better. Went to [facility] for second opinion, continued vimpat, and then LEV was switched to [facility]. A few months ago, tried cutting out sugars/carb to lose some weight, and noticed that her seizures almost stopped. Now occ if she has sugars for 2-3 days, then she may have a few breakthrough partial seizures (aphasia x 20 sec).   UPDATE 11/03/13: Since last visit, continued on LTG 100mg  BID, but started having anxiety attacks and "disturbing" images in her mind (knife etc). She was advised to taper off LTG by my colleagues in my absence. Since that time, she has come down to LTG 25mg  BID, and the anxiety / images have subsided. Now her aphasia / partial seizures are back up to 2 per day. No grand mal seizures since 2012.  UPDATE 10/14/13: Since last visit, tried vimpat but was too expensive. Now on LTG 100mg  BID, and doing better. At LTG 75mg  BID, was event free x 2 weeks.  Now on LTG 100mg  BID x 2 weeks, and only had 1 event yesterday. Events are consistent with 30 seconds of exp and receptive aphasia. No confusion, convulsions or staring. Able to walk, move and function. She is awake/aware when these are occuring.   UPDATE 07/22/13 (LL): Patient returns for earlier revisit due to increased partial seizures. She has almost daily episodes of being unable to speak and staring, but knows what is going on around her. Other times she gets an aura of a seizure, with a deja vu feeling, but then it never happens. Sometimes this is followed by headache and tiredness. Her dose of Keppra is 250 mg in the morning and 500 mg at night. At higher dose she states she had depression, worse ringing in the ears and hair falling out. Her last EEG was in [place] and was video-monitored. She thinks her last MRI was 2 years ago.   PRIOR HPI 02/04/13 (VP): 36 year old right-handed female here for evaluation of seizure disorder. Patient was born full-term, no complications with normal development. Family history seizure only notable in patient's mother's uncle. Patient has had 2 episodes of seizure in her life on 01/04/11 and 03/20/11. First seizure, patient was in the car with her husband and suddenly became unresponsive without warning. She has generalized convulsions. Took her about 20 minutes to regain consciousness. She was slowing confused for several hours. She bit her tongue but did not have incontinence. Second episode occurred in a similar fashion to the first.  Following the second seizure patient started on levetiracetam 500 mg twice a day. Within a few months, patient was complaining of depression and being in the ears as well as hair loss and therefore levetiracetam dose was decreased to 500 mg the morning and 500 mg at night. Around this time patient began to have intermittent episodes lasting 1 minute at a time, of expressive and receptive aphasia. She did not lose consciousness. She is able to  stand up sit down involuntary move her arms and legs. Sometimes she is able to get one or 2 words out. These events occur once per week, in clusters of up to 5 in a day.  Patient had video EEG monitoring in [place], which she thinks showed left frontal epileptiform discharges. Patient was pregnant in 2013, and stayed on levetiracetam during the pregnancy. She had no seizures during pregnancy. She gave birth to 2 healthy twin girls, who are now 54 months old. Patient reports that she has not had significant seizures in about two years. Recently, she has been having seizures and would like to be assessed and medication changes made as appropriate.   REVIEW OF SYSTEMS: Full 14 system review of systems performed and negative except: hair loss.     ALLERGIES: Allergies  Allergen Reactions  . No Known Allergies     HOME MEDICATIONS: Outpatient Medications Prior to Visit  Medication Sig Dispense Refill  . BIOTIN PO Take by mouth.    . clindamycin-benzoyl peroxide (BENZACLIN) gel APP TO FACE QAM  11  . Lacosamide (VIMPAT) 150 MG TABS Take 1 tablet (150 mg total) by mouth 2 (two) times daily. 180 tablet 3  . Multiple Vitamins-Minerals (WOMENS MULTIVITAMIN PLUS PO) Take 1 tablet by mouth daily.    Marland Kitchen zonisamide (ZONEGRAN) 100 MG capsule Take 1 capsule (100 mg total) by mouth at bedtime. 90 capsule 3   No facility-administered medications prior to visit.     PAST MEDICAL HISTORY: Past Medical History:  Diagnosis Date  . Acid reflux   . Kidney stone   . Seizures ([place])     PAST SURGICAL HISTORY: Past Surgical History:  Procedure Laterality Date  . CESAREAN SECTION    . HYSTEROSCOPY  08/2011    FAMILY HISTORY: Family History  Problem Relation Age of Onset  . Breast cancer Mother   . Seizures Maternal Uncle   . Hypertension Brother     SOCIAL HISTORY:  Social History   Social History  . Marital status: Married    Spouse name: [name]  . Number of children: 2  . Years of education:  College   Occupational History  . N/A    Social History Main Topics  . Smoking status: Never Smoker  . Smokeless tobacco: Never Used  . Alcohol use 0.6 oz/week    1 Glasses of wine per week     Comment: occasionally  . Drug use: No  . Sexual activity: Not on file   Other Topics Concern  . Not on file   Social History Narrative   Patient lives at home with family.   Caffeine Use: 1 tea or coffee daily in a.m     PHYSICAL EXAM  Vitals:   02/15/17 1013  BP: 96/61  Pulse: 75  Weight: 114 lb 6.4 oz (51.9 kg)  Height: 5\' 4"  (1.626 m)    Not recorded     Wt Readings from Last 3 Encounters:  02/15/17 114 lb 6.4 oz (51.9 kg)  02/15/16 108 lb (49 kg)  07/07/15 106 lb 12.8 oz (48.4 kg)   Body mass index is 19.64 kg/m.   GENERAL EXAM/CONSTITUTIONAL:  Vitals: as above  Patient is in no distress; well developed, nourished and groomed; neck is supple  CARDIOVASCULAR:  Examination of carotid arteries is normal; no carotid bruits  Regular rate and rhythm, no murmurs  Examination of peripheral vascular system by observation and palpation is normal   MUSCULOSKELETAL:  Gait, strength, tone, movements noted in Neurologic exam below  NEUROLOGIC: MENTAL STATUS:   awake, alert  recent and remote memory intact  normal attention and concentration  language fluent, comprehension intact, naming intact,   fund of knowledge appropriate  CRANIAL NERVE:   2nd, 3rd, 4th, 6th - pupils equal and reactive to light, visual fields full to confrontation, extraocular muscles intact, no nystagmus  5th - facial sensation symmetric  7th - facial strength symmetric  8th - hearing intact  9th - palate elevates symmetrically, uvula midline  11th - shoulder shrug symmetric  12th - tongue protrusion midline  MOTOR:   normal bulk and tone, full strength in the BUE, BLE  SENSORY:   normal and symmetric to light touch, temperature, vibration  COORDINATION:    finger-nose-finger, fine finger movements normal  REFLEXES:   deep tendon reflexes present and symmetric  GAIT/STATION:   narrow based gait    DIAGNOSTIC DATA (LABS, IMAGING, TESTING) - I reviewed patient records, labs, notes, testing and imaging myself where available.  Lab Results  Component Value Date   WBC 13.8 (H) 02/21/2015      Component Value Date/Time   NA 140 02/21/2015 1030   No results found for: CHOL No results found for: HGBA1C No results found for: VITAMINB12 No results found for: TSH   11/16/11 - 11/17/11 VEEG ([facility], report) - left anterior temporal spike, slow wave discharges and intermittent focal slowing over left temporal region.    ASSESSMENT AND PLAN  36 y.o. 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 However, now having intermittent partial seizures (1-2 aphasia spells per day, 30-60 seconds at a time) without motor symptoms or alteration of consciousness. Was doing well on LEV 250 / 500 + LTG 100 BID, but then had side effects from LTG (anxiety / mood / disturbing mental images).   Was doing better on vimpat, zonisamide and sugar/carb restriction. Then had kidney stone in August 2016. Now on higher vimpat and decreased ZNG, and doing better.    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 Greater than 50% of time was spent in counseling and coordination of care with patient. In summary we discussed:  - continue vimpat 150mg  twice a day +zonisamide 100mg  daily  - In future we may consider medication adjustment if hair loss or other side effects increase: Could consider increasing Vimpat and decreasing zonisamide, or changing zonisamide to an alternate medication (such as carbamazepine, oxcarbazepine, gabapentin, onfi) - ok to limit excessive simple  carbs/sugars from diet - ok to drive from my standpoint (no convulsive seizures since 2012; she is compliant with medications; current spells/seizures are partial only, aphasia only, and do not affect her ability to drive)  Meds ordered this encounter  Medications  . zonisamide (ZONEGRAN) 100 MG capsule    Sig: Take 1 capsule (100 mg total) by mouth at bedtime.    Dispense:  90 capsule    Refill:  4  . Lacosamide (VIMPAT) 150 MG TABS    Sig: Take 1 tablet (150 mg total) by mouth 2 (two) times daily.    Dispense:  180 tablet    Refill:  4   Return in about 1 year (around 02/15/2018).    Penni Bombard, MD 09/17/7423, 95:63 AM Certified in Neurology, Neurophysiology and Neuroimaging  [facility] [address], [place], [place] [address] [phone]

## 2017-03-25 NOTE — Progress Notes (Signed)
Vimpat 100 mg RX dated 02/07/16 from Dr. Star Age was never picked up from the front desk. Will shred.

## 2017-08-15 ENCOUNTER — Other Ambulatory Visit: Payer: Self-pay | Admitting: Diagnostic Neuroimaging

## 2017-08-16 ENCOUNTER — Encounter: Payer: Self-pay | Admitting: Diagnostic Neuroimaging

## 2017-08-16 ENCOUNTER — Telehealth: Payer: Self-pay | Admitting: Diagnostic Neuroimaging

## 2017-08-16 NOTE — Telephone Encounter (Signed)
Pt is out of town and forgot her medication for Lacosamide (VIMPAT) 150 MG TABS and zonisamide (ZONEGRAN) 100 MG capsule pt only needs 3 days worth sent to [place], [place] [address]

## 2017-08-16 NOTE — Telephone Encounter (Addendum)
I spoke to April, pharmacist in [place], [place], [place], and gave verbal order for prescription for vimpat 150mg  po BID # 6 no refill and Zonisamide 100mg  po daily at bedtime # 3.  These were for 3 days only as pt forgot her medications.

## 2017-08-20 ENCOUNTER — Encounter: Payer: Self-pay | Admitting: Diagnostic Neuroimaging

## 2017-10-21 ENCOUNTER — Encounter: Payer: Self-pay | Admitting: Diagnostic Neuroimaging

## 2017-10-22 ENCOUNTER — Other Ambulatory Visit: Payer: Self-pay | Admitting: *Deleted

## 2017-10-22 MED ORDER — LACOSAMIDE 150 MG PO TABS: 1.0000 | ORAL_TABLET | Freq: Two times a day (BID) | ORAL | 3 refills | Status: DC

## 2017-10-22 NOTE — Telephone Encounter (Signed)
Per patient's my chart request, [facility] with note to [facility], re: patient needs to schedule FU. Will reply to her my chart message with same information. Rx pended to Dr Leta Baptist.

## 2017-10-23 ENCOUNTER — Telehealth: Payer: Self-pay | Admitting: *Deleted

## 2017-10-23 NOTE — Telephone Encounter (Signed)
LVM informing patient this RN has scheduled her one year FU for Mar 04, 2018, 11:00 am. Advised she will get a reminder call. Left number in the event she wanted to call and change this. Did advise her that Dr Gladstone Lighter FU are booking into Sept.

## 2018-01-04 ENCOUNTER — Encounter: Payer: Self-pay | Admitting: Diagnostic Neuroimaging

## 2018-01-06 ENCOUNTER — Telehealth: Payer: Self-pay | Admitting: *Deleted

## 2018-01-06 MED ORDER — ZONISAMIDE 100 MG PO CAPS: 100.0000 mg | ORAL_CAPSULE | Freq: Every day | ORAL | 4 refills | Status: DC

## 2018-01-06 NOTE — Telephone Encounter (Signed)
Received my chart request to refill zonisamide. Patient has FU in August, will e scribe to [place] as she requested.  This RN will reply to her message to inform.

## 2018-02-17 ENCOUNTER — Ambulatory Visit: Payer: BLUE CROSS/BLUE SHIELD | Admitting: Diagnostic Neuroimaging

## 2018-03-04 ENCOUNTER — Ambulatory Visit: Payer: BLUE CROSS/BLUE SHIELD | Admitting: Diagnostic Neuroimaging

## 2018-03-04 ENCOUNTER — Encounter: Payer: Self-pay | Admitting: Diagnostic Neuroimaging

## 2018-03-04 VITALS — BP 102/66 | HR 61 | Ht 64.0 in | Wt 123.0 lb

## 2018-03-04 DIAGNOSIS — G40909 Epilepsy, unspecified, not intractable, without status epilepticus: Secondary | ICD-10-CM | POA: Diagnosis not present

## 2018-03-04 DIAGNOSIS — G40109 Localization-related (focal) (partial) symptomatic epilepsy and epileptic syndromes with simple partial seizures, not intractable, without status epilepticus: Secondary | ICD-10-CM | POA: Diagnosis not present

## 2018-03-04 MED ORDER — LACOSAMIDE 150 MG PO TABS: 1.0000 | ORAL_TABLET | Freq: Two times a day (BID) | ORAL | 4 refills | Status: DC

## 2018-03-04 MED ORDER — ZONISAMIDE 100 MG PO CAPS: 100.0000 mg | ORAL_CAPSULE | Freq: Every day | ORAL | 4 refills | Status: DC

## 2018-03-04 NOTE — Progress Notes (Signed)
[facility]  PATIENT: Beth Vasquez DOB: [birth year]  REFERRING CLINICIAN:  HISTORY FROM: patient  REASON FOR VISIT: follow up    HISTORICAL  CHIEF COMPLAINT:  Chief Complaint  Patient presents with  . Follow-up    Here alone room 6 Localization-related epilepsy. Patient reports, no new seizure activity. Reports vimpat is going well.     HISTORY OF PRESENT ILLNESS:   UPDATE (03/04/18, VRP): Since last visit, doing patient continues to have one spell per month of transient aphasia lasting less than 1 minute. Symptoms are moderate.  Patient does note persistent mild hair loss.  She also notes ongoing slightly worsening memory loss in conversations with family and friends.  Patient notes that carbohydrate intake and rest affect the frequency of these spells.  No other alleviating or aggravating factors. Tolerating Vimpat and zonisamide.  UPDATE 02/15/17: Since last visit patient doing well. No major seizures. Patient has rare aphasia spells if she increases her carbohydrate intake or is not get proper rest. Patient has noticed more hair loss in the last 1 year.  UPDATE 02/15/16: Since last visit, doing well. Seizures are less frequent (more than 1 month per event). Hydration, low carbs, rest seem to help avoid seizures. Tolerating meds. No kidney stone issues. Has gained 10lbs which was desirable for patient.   UPDATE 07/07/15: Since last visit, still with small clusters of aphasia events (3-4 events in a day; once per month). Overall stable. Had kidney stone (severe) August 2016, went to ER. No recurrent stones.   UPDATE 12/28/14: Since last visit, doing much better. Went to [facility] for second opinion, continued vimpat, and then LEV was switched to [facility]. A few months ago, tried cutting out sugars/carb to lose some weight, and noticed that her seizures almost stopped. Now occ if she has sugars for 2-3 days, then she may have a few breakthrough partial seizures (aphasia x 20  sec).   UPDATE 11/03/13: Since last visit, continued on LTG 100mg  BID, but started having anxiety attacks and "disturbing" images in her mind (knife etc). She was advised to taper off LTG by my colleagues in my absence. Since that time, she has come down to LTG 25mg  BID, and the anxiety / images have subsided. Now her aphasia / partial seizures are back up to 2 per day. No grand mal seizures since 2012.  UPDATE 10/14/13: Since last visit, tried vimpat but was too expensive. Now on LTG 100mg  BID, and doing better. At LTG 75mg  BID, was event free x 2 weeks. Now on LTG 100mg  BID x 2 weeks, and only had 1 event yesterday. Events are consistent with 30 seconds of exp and receptive aphasia. No confusion, convulsions or staring. Able to walk, move and function. She is awake/aware when these are occuring.   UPDATE 07/22/13 (LL): Patient returns for earlier revisit due to increased partial seizures. She has almost daily episodes of being unable to speak and staring, but knows what is going on around her. Other times she gets an aura of a seizure, with a deja vu feeling, but then it never happens. Sometimes this is followed by headache and tiredness. Her dose of Keppra is 250 mg in the morning and 500 mg at night. At higher dose she states she had depression, worse ringing in the ears and hair falling out. Her last EEG was in [place] and was video-monitored. She thinks her last MRI was 2 years ago.   PRIOR HPI 02/04/13 (VP): 37 year old right-handed female here for evaluation  of seizure disorder. Patient was born full-term, no complications with normal development. Family history seizure only notable in patient's mother's uncle. Patient has had 2 episodes of seizure in her life on 01/04/11 and 03/20/11. First seizure, patient was in the car with her husband and suddenly became unresponsive without warning. She has generalized convulsions. Took her about 20 minutes to regain consciousness. She was slowing confused for several  hours. She bit her tongue but did not have incontinence. Second episode occurred in a similar fashion to the first. Following the second seizure patient started on levetiracetam 500 mg twice a day. Within a few months, patient was complaining of depression and being in the ears as well as hair loss and therefore levetiracetam dose was decreased to 500 mg the morning and 500 mg at night. Around this time patient began to have intermittent episodes lasting 1 minute at a time, of expressive and receptive aphasia. She did not lose consciousness. She is able to stand up sit down involuntary move her arms and legs. Sometimes she is able to get one or 2 words out. These events occur once per week, in clusters of up to 5 in a day.  Patient had video EEG monitoring in [place], which she thinks showed left frontal epileptiform discharges. Patient was pregnant in 2013, and stayed on levetiracetam during the pregnancy. She had no seizures during pregnancy. She gave birth to 2 healthy twin girls, who are now 91 months old. Patient reports that she has not had significant seizures in about two years. Recently, she has been having seizures and would like to be assessed and medication changes made as appropriate.   REVIEW OF SYSTEMS: Full 14 system review of systems performed and negative except: Memory loss.    ALLERGIES: Allergies  Allergen Reactions  . No Known Allergies     HOME MEDICATIONS: Outpatient Medications Prior to Visit  Medication Sig Dispense Refill  . BIOTIN PO Take by mouth.    . Lacosamide (VIMPAT) 150 MG TABS Take 1 tablet (150 mg total) by mouth 2 (two) times daily. 180 tablet 3  . Multiple Vitamins-Minerals (WOMENS MULTIVITAMIN PLUS PO) Take 1 tablet by mouth daily.    Marland Kitchen zonisamide (ZONEGRAN) 100 MG capsule Take 1 capsule (100 mg total) by mouth at bedtime. 90 capsule 4  . clindamycin-benzoyl peroxide (BENZACLIN) gel APP TO FACE QAM  11  . tretinoin (RETIN-A) 0.05 % cream Apply topically at  bedtime.     No facility-administered medications prior to visit.     PAST MEDICAL HISTORY: Past Medical History:  Diagnosis Date  . Acid reflux   . Kidney stone   . Seizures ([place])     PAST SURGICAL HISTORY: Past Surgical History:  Procedure Laterality Date  . CESAREAN SECTION    . HYSTEROSCOPY  08/2011    FAMILY HISTORY: Family History  Problem Relation Age of Onset  . Breast cancer Mother   . Seizures Maternal Uncle   . Hypertension Brother     SOCIAL HISTORY:  Social History   Socioeconomic History  . Marital status: Married    Spouse name: [name]  . Number of children: 2  . Years of education: College  . Highest education level: Not on file  Occupational History  . Occupation: N/A  Social Needs  . Financial resource strain: Not on file  . Food insecurity:    Worry: Not on file    Inability: Not on file  . Transportation needs:    Medical: Not on  file    Non-medical: Not on file  Tobacco Use  . Smoking status: Never Smoker  . Smokeless tobacco: Never Used  Substance and Sexual Activity  . Alcohol use: Yes    Alcohol/week: 1.0 standard drinks    Types: 1 Glasses of wine per week    Comment: occasionally  . Drug use: No  . Sexual activity: Not on file  Lifestyle  . Physical activity:    Days per week: Not on file    Minutes per session: Not on file  . Stress: Not on file  Relationships  . Social connections:    Talks on phone: Not on file    Gets together: Not on file    Attends religious service: Not on file    Active member of club or organization: Not on file    Attends meetings of clubs or organizations: Not on file    Relationship status: Not on file  . Intimate partner violence:    Fear of current or ex partner: Not on file    Emotionally abused: Not on file    Physically abused: Not on file    Forced sexual activity: Not on file  Other Topics Concern  . Not on file  Social History Narrative   Patient lives at home with family.    Caffeine Use: 1 tea or coffee daily in a.m     PHYSICAL EXAM  Vitals:   03/04/18 1038  BP: 102/66  Pulse: 61  Weight: 123 lb (55.8 kg)  Height: 5\' 4"  (1.626 m)    Not recorded     Wt Readings from Last 3 Encounters:  03/04/18 123 lb (55.8 kg)  02/15/17 114 lb 6.4 oz (51.9 kg)  02/15/16 108 lb (49 kg)   Body mass index is 21.11 kg/m.   GENERAL EXAM/CONSTITUTIONAL:  Vitals: as above  Patient is in no distress; well developed, nourished and groomed; neck is supple  CARDIOVASCULAR:  Examination of carotid arteries is normal; no carotid bruits  Regular rate and rhythm, no murmurs  Examination of peripheral vascular system by observation and palpation is normal   MUSCULOSKELETAL:  Gait, strength, tone, movements noted in Neurologic exam below  NEUROLOGIC: MENTAL STATUS:   awake, alert  recent and remote memory intact  normal attention and concentration  language fluent, comprehension intact, naming intact,   fund of knowledge appropriate  CRANIAL NERVE:   2nd, 3rd, 4th, 6th - pupils equal and reactive to light, visual fields full to confrontation, extraocular muscles intact, no nystagmus  5th - facial sensation symmetric  7th - facial strength symmetric  8th - hearing intact  9th - palate elevates symmetrically, uvula midline  11th - shoulder shrug symmetric  12th - tongue protrusion midline  MOTOR:   normal bulk and tone, full strength in the BUE, BLE  SENSORY:   normal and symmetric to light touch, temperature, vibration  COORDINATION:   finger-nose-finger, fine finger movements normal  REFLEXES:   deep tendon reflexes present and symmetric  GAIT/STATION:   narrow based gait    DIAGNOSTIC DATA (LABS, IMAGING, TESTING) - I reviewed patient records, labs, notes, testing and imaging myself where available.  Lab Results  Component Value Date   WBC 13.8 (H) 02/21/2015      Component Value Date/Time   NA 140 02/21/2015 1030    No results found for: CHOL No results found for: HGBA1C No results found for: VITAMINB12 No results found for: TSH   11/16/11 - 11/17/11 VEEG ([place]  [facility], report) - left anterior temporal spike, slow wave discharges and intermittent focal slowing over left temporal region.    ASSESSMENT AND PLAN  37 y.o. 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 However, now having intermittent partial seizures (1-2 aphasia spells per day, 30-60 seconds at a time) without motor symptoms or alteration of consciousness. Was doing well on LEV 250 / 500 + LTG 100 BID, but then had side effects from LTG (anxiety / mood / disturbing mental images).   Was doing better on vimpat, zonisamide and sugar/carb restriction. Then had kidney stone in August 2016. Now on higher vimpat and decreased ZNG, and doing better.    Dx:  Seizure disorder ([place]) - Plan: EEG adult    PLAN:  I spent 25 minutes of face to face time with patient. Greater than 50% of time was spent in counseling and coordination of care with patient. This is necessary because patient's symptoms are not optimally controlled / improved. In summary we discussed: - Diagnostic results, impressions, or recommended diagnostic studies: partial seizures - Prognosis: good-fair - Risks and benefits of management (treatment) options: medication mgmt / adjustment - Instructions for management (treatment) or follow-up: reviewed  - Importance of compliance with treatment options: reviewed   SEIZURE DISORDER - stable, but having some memory loss issues (? Medication side effect vs subclinical seizures) - check EEG - continue vimpat 150mg  twice a day +zonisamide 100mg  daily  - In future we may consider medication adjustment if hair loss or other side effects increase: Could consider increasing Vimpat and decreasing zonisamide, or  changing zonisamide to an alternate medication (such as carbamazepine, oxcarbazepine, gabapentin, onfi) - ok to limit excessive simple carbs/sugars from diet - ok to drive from my standpoint (no convulsive seizures since 2012; she is compliant with medications; current spells/seizures are partial only, aphasia only, and do not affect her ability to drive)  Orders Placed This Encounter  Procedures  . EEG adult   Meds ordered this encounter  Medications  . zonisamide (ZONEGRAN) 100 MG capsule    Sig: Take 1 capsule (100 mg total) by mouth at bedtime.    Dispense:  90 capsule    Refill:  4  . Lacosamide (VIMPAT) 150 MG TABS    Sig: Take 1 tablet (150 mg total) by mouth 2 (two) times daily.    Dispense:  180 tablet    Refill:  4   Return in about 6 months (around 09/04/2018).    Penni Bombard, MD 10/11/9240, 68:34 AM Certified in Neurology, Neurophysiology and Neuroimaging  [facility] [address], [place], [place] [address] [phone]

## 2018-03-11 ENCOUNTER — Ambulatory Visit: Payer: BLUE CROSS/BLUE SHIELD | Admitting: Diagnostic Neuroimaging

## 2018-03-11 DIAGNOSIS — G40909 Epilepsy, unspecified, not intractable, without status epilepticus: Secondary | ICD-10-CM | POA: Diagnosis not present

## 2018-03-20 NOTE — Procedures (Signed)
   [facility]  EEG (ELECTROENCEPHALOGRAM) REPORT   STUDY DATE: 03/11/18 PATIENT NAME: Beth Vasquez DOB: 02/03/1981 MRN: [id number]  ORDERING CLINICIAN: Andrey Spearman, MD   TECHNOLOGIST: Arelia Longest  TECHNIQUE: Electroencephalogram was recorded utilizing standard 10-20 system of lead placement and reformatted into average and bipolar montages.  RECORDING TIME: 22 minutes  ACTIVATION: hyperventilation and photic stimulation  CLINICAL INFORMATION: 37 year old female with transient aphasia and seizure disorder.  FINDINGS: Posterior dominant background rhythms, which attenuate with eye opening, ranging 8-10 hertz and 30-40 microvolts. No focal, lateralizing, epileptiform activity or seizures are seen. Patient recorded in the awake and drowsy state. EKG channel shows regular rhythm of 70-75 beats per minute.   IMPRESSION:   Normal EEG in the awake and drowsy states.    INTERPRETING PHYSICIAN:  Penni Bombard, MD Certified in Neurology, Neurophysiology and Neuroimaging  [facility] [address] [place], [place] [address] [phone]

## 2018-03-24 ENCOUNTER — Telehealth: Payer: Self-pay | Admitting: *Deleted

## 2018-03-24 NOTE — Telephone Encounter (Signed)
Spoke to pt and relayed that the EEG was normal.  Continue current plan of care.  I reread the ofc note plan to her.  She verbalized understanding.

## 2018-09-09 ENCOUNTER — Ambulatory Visit: Payer: BLUE CROSS/BLUE SHIELD | Admitting: Diagnostic Neuroimaging

## 2018-09-09 ENCOUNTER — Encounter: Payer: Self-pay | Admitting: Diagnostic Neuroimaging

## 2018-09-09 VITALS — BP 103/61 | HR 64 | Ht 64.0 in | Wt 119.4 lb

## 2018-09-09 DIAGNOSIS — G40109 Localization-related (focal) (partial) symptomatic epilepsy and epileptic syndromes with simple partial seizures, not intractable, without status epilepticus: Secondary | ICD-10-CM | POA: Diagnosis not present

## 2018-09-09 DIAGNOSIS — G40909 Epilepsy, unspecified, not intractable, without status epilepticus: Secondary | ICD-10-CM | POA: Diagnosis not present

## 2018-09-09 MED ORDER — LACOSAMIDE 150 MG PO TABS: 1.0000 | ORAL_TABLET | Freq: Two times a day (BID) | ORAL | 4 refills | Status: DC

## 2018-09-09 MED ORDER — ZONISAMIDE 100 MG PO CAPS: 100.0000 mg | ORAL_CAPSULE | Freq: Every day | ORAL | 4 refills | Status: DC

## 2018-09-09 NOTE — Progress Notes (Signed)
[facility]  PATIENT: Beth Vasquez DOB: 1980-12-16  REFERRING CLINICIAN:  HISTORY FROM: patient  REASON FOR VISIT: follow up    HISTORICAL  CHIEF COMPLAINT:  Chief Complaint  Patient presents with  . Seizures    rm 6, "No seizure activity"  . Follow-up    6 month    HISTORY OF PRESENT ILLNESS:   UPDATE (09/09/18, VRP): Since last visit, doing well. Now on improved exercise program and feels much better. Also improving her nutrition. Memory lapses are stable. Aphasia attacks have stopped. Tolerating meds. No major seizures.    UPDATE (03/04/18, VRP): Since last visit, doing patient continues to have one spell per month of transient aphasia lasting less than 1 minute. Symptoms are moderate.  Patient does note persistent mild hair loss.  She also notes ongoing slightly worsening memory loss in conversations with family and friends.  Patient notes that carbohydrate intake and rest affect the frequency of these spells.  No other alleviating or aggravating factors. Tolerating Vimpat and zonisamide.  UPDATE 02/15/17: Since last visit patient doing well. No major seizures. Patient has rare aphasia spells if she increases her carbohydrate intake or is not get proper rest. Patient has noticed more hair loss in the last 1 year.  UPDATE 02/15/16: Since last visit, doing well. Seizures are less frequent (more than 1 month per event). Hydration, low carbs, rest seem to help avoid seizures. Tolerating meds. No kidney stone issues. Has gained 10lbs which was desirable for patient.   UPDATE 07/07/15: Since last visit, still with small clusters of aphasia events (3-4 events in a day; once per month). Overall stable. Had kidney stone (severe) August 2016, went to ER. No recurrent stones.   UPDATE 12/28/14: Since last visit, doing much better. Went to [facility] for second opinion, continued vimpat, and then LEV was switched to [facility]. A few months ago, tried cutting out sugars/carb to lose  some weight, and noticed that her seizures almost stopped. Now occ if she has sugars for 2-3 days, then she may have a few breakthrough partial seizures (aphasia x 20 sec).   UPDATE 11/03/13: Since last visit, continued on LTG 100mg  BID, but started having anxiety attacks and "disturbing" images in her mind (knife etc). She was advised to taper off LTG by my colleagues in my absence. Since that time, she has come down to LTG 25mg  BID, and the anxiety / images have subsided. Now her aphasia / partial seizures are back up to 2 per day. No grand mal seizures since 2012.  UPDATE 10/14/13: Since last visit, tried vimpat but was too expensive. Now on LTG 100mg  BID, and doing better. At LTG 75mg  BID, was event free x 2 weeks. Now on LTG 100mg  BID x 2 weeks, and only had 1 event yesterday. Events are consistent with 30 seconds of exp and receptive aphasia. No confusion, convulsions or staring. Able to walk, move and function. She is awake/aware when these are occuring.   UPDATE 07/22/13 (LL): Patient returns for earlier revisit due to increased partial seizures. She has almost daily episodes of being unable to speak and staring, but knows what is going on around her. Other times she gets an aura of a seizure, with a deja vu feeling, but then it never happens. Sometimes this is followed by headache and tiredness. Her dose of Keppra is 250 mg in the morning and 500 mg at night. At higher dose she states she had depression, worse ringing in the ears and hair  falling out. Her last EEG was in [place] and was video-monitored. She thinks her last MRI was 2 years ago.   PRIOR HPI 02/04/13 (VP): 38 year old right-handed female here for evaluation of seizure disorder. Patient was born full-term, no complications with normal development. Family history seizure only notable in patient's mother's uncle. Patient has had 2 episodes of seizure in her life on 01/04/11 and 03/20/11. First seizure, patient was in the car with her husband and  suddenly became unresponsive without warning. She has generalized convulsions. Took her about 20 minutes to regain consciousness. She was slowing confused for several hours. She bit her tongue but did not have incontinence. Second episode occurred in a similar fashion to the first. Following the second seizure patient started on levetiracetam 500 mg twice a day. Within a few months, patient was complaining of depression and being in the ears as well as hair loss and therefore levetiracetam dose was decreased to 500 mg the morning and 500 mg at night. Around this time patient began to have intermittent episodes lasting 1 minute at a time, of expressive and receptive aphasia. She did not lose consciousness. She is able to stand up sit down involuntary move her arms and legs. Sometimes she is able to get one or 2 words out. These events occur once per week, in clusters of up to 5 in a day.  Patient had video EEG monitoring in [place], which she thinks showed left frontal epileptiform discharges. Patient was pregnant in 2013, and stayed on levetiracetam during the pregnancy. She had no seizures during pregnancy. She gave birth to 2 healthy twin girls, who are now 57 months old. Patient reports that she has not had significant seizures in about two years. Recently, she has been having seizures and would like to be assessed and medication changes made as appropriate.   REVIEW OF SYSTEMS: Full 14 system review of systems performed and negative except: Memory loss.    ALLERGIES: Allergies  Allergen Reactions  . No Known Allergies     HOME MEDICATIONS: Outpatient Medications Prior to Visit  Medication Sig Dispense Refill  . BIOTIN PO Take by mouth.    . Lacosamide (VIMPAT) 150 MG TABS Take 1 tablet (150 mg total) by mouth 2 (two) times daily. 180 tablet 4  . Multiple Vitamins-Minerals (WOMENS MULTIVITAMIN PLUS PO) Take 1 tablet by mouth daily.    Marland Kitchen zonisamide (ZONEGRAN) 100 MG capsule Take 1 capsule (100 mg  total) by mouth at bedtime. 90 capsule 4   No facility-administered medications prior to visit.     PAST MEDICAL HISTORY: Past Medical History:  Diagnosis Date  . Acid reflux   . Kidney stone   . Seizures ([place])     PAST SURGICAL HISTORY: Past Surgical History:  Procedure Laterality Date  . CESAREAN SECTION    . HYSTEROSCOPY  08/2011    FAMILY HISTORY: Family History  Problem Relation Age of Onset  . Breast cancer Mother   . Seizures Maternal Uncle   . Hypertension Brother     SOCIAL HISTORY:  Social History   Socioeconomic History  . Marital status: Married    Spouse name: [name]  . Number of children: 2  . Years of education: College  . Highest education level: Not on file  Occupational History  . Occupation: N/A  Social Needs  . Financial resource strain: Not on file  . Food insecurity:    Worry: Not on file    Inability: Not on file  . Transportation  needs:    Medical: Not on file    Non-medical: Not on file  Tobacco Use  . Smoking status: Never Smoker  . Smokeless tobacco: Never Used  Substance and Sexual Activity  . Alcohol use: Yes    Alcohol/week: 1.0 standard drinks    Types: 1 Glasses of wine per week    Comment: occasionally  . Drug use: No  . Sexual activity: Not on file  Lifestyle  . Physical activity:    Days per week: Not on file    Minutes per session: Not on file  . Stress: Not on file  Relationships  . Social connections:    Talks on phone: Not on file    Gets together: Not on file    Attends religious service: Not on file    Active member of club or organization: Not on file    Attends meetings of clubs or organizations: Not on file    Relationship status: Not on file  . Intimate partner violence:    Fear of current or ex partner: Not on file    Emotionally abused: Not on file    Physically abused: Not on file    Forced sexual activity: Not on file  Other Topics Concern  . Not on file  Social History Narrative   Patient  lives at home with family.   Caffeine Use: 1 tea or coffee daily in a.m     PHYSICAL EXAM  Vitals:   09/09/18 0835  BP: 103/61  Pulse: 64  Weight: 119 lb 6.4 oz (54.2 kg)  Height: 5\' 4"  (1.626 m)    Not recorded     Wt Readings from Last 3 Encounters:  09/09/18 119 lb 6.4 oz (54.2 kg)  03/04/18 123 lb (55.8 kg)  02/15/17 114 lb 6.4 oz (51.9 kg)   Body mass index is 20.49 kg/m.   GENERAL EXAM/CONSTITUTIONAL:  Vitals: as above  Patient is in no distress; well developed, nourished and groomed; neck is supple  CARDIOVASCULAR:  Examination of carotid arteries is normal; no carotid bruits  Regular rate and rhythm, no murmurs  Examination of peripheral vascular system by observation and palpation is normal   MUSCULOSKELETAL:  Gait, strength, tone, movements noted in Neurologic exam below  NEUROLOGIC: MENTAL STATUS:   awake, alert  recent and remote memory intact  normal attention and concentration  language fluent, comprehension intact, naming intact,   fund of knowledge appropriate  CRANIAL NERVE:   2nd, 3rd, 4th, 6th - pupils equal and reactive to light, visual fields full to confrontation, extraocular muscles intact, no nystagmus  5th - facial sensation symmetric  7th - facial strength symmetric  8th - hearing intact  9th - palate elevates symmetrically, uvula midline  11th - shoulder shrug symmetric  12th - tongue protrusion midline  MOTOR:   normal bulk and tone, full strength in the BUE, BLE  SENSORY:   normal and symmetric to light touch, temperature, vibration  COORDINATION:   finger-nose-finger, fine finger movements normal  REFLEXES:   deep tendon reflexes present and symmetric  GAIT/STATION:   narrow based gait    DIAGNOSTIC DATA (LABS, IMAGING, TESTING) - I reviewed patient records, labs, notes, testing and imaging myself where available.  Lab Results  Component Value Date   WBC 13.8 (H) 02/21/2015        Component Value Date/Time   NA 140 02/21/2015 1030   No results found for: CHOL No results found for: HGBA1C No results found for: [id number]  No results found for: TSH   11/16/11 - 11/17/11 VEEG ([facility], report) - left anterior temporal spike, slow wave discharges and intermittent focal slowing over left temporal region.    ASSESSMENT AND PLAN  38 y.o. 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 However, now having intermittent partial seizures (1-2 aphasia spells per day, 30-60 seconds at a time) without motor symptoms or alteration of consciousness. Was doing well on LEV 250 / 500 + LTG 100 BID, but then had side effects from LTG (anxiety / mood / disturbing mental images).   Was doing better on vimpat, zonisamide and sugar/carb restriction. Then had kidney stone in August 2016. Now on higher vimpat and decreased ZNG, and doing better.    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[place])    PLAN:  SEIZURE DISORDER (improved) - continue vimpat 150mg  twice a day + zonisamide 100mg  daily  - In future we may consider medication adjustment if hair loss or other side effects increase: Could consider increasing Vimpat and decreasing zonisamide, or changing zonisamide to an alternate medication (such as carbamazepine, oxcarbazepine, gabapentin, onfi) - ok to limit excessive simple carbs/sugars from diet - ok to drive from my standpoint (no convulsive seizures since 2012; she is compliant with medications; current spells/seizures are partial only, aphasia only, and do not affect her ability to drive)  Meds ordered this encounter  Medications  . zonisamide (ZONEGRAN) 100 MG capsule    Sig: Take 1 capsule (100 mg total) by mouth at bedtime.    Dispense:  90 capsule    Refill:  4  . Lacosamide (VIMPAT) 150 MG TABS    Sig: Take 1 tablet (150  mg total) by mouth 2 (two) times daily.    Dispense:  180 tablet    Refill:  4   Return in about 8 months (around 05/10/2019).    Penni Bombard, MD 5/36/4680, 3:21 AM Certified in Neurology, Neurophysiology and Neuroimaging  [facility] [address] [place], [place] [address] [phone]

## 2019-03-27 ENCOUNTER — Telehealth: Payer: Self-pay | Admitting: Diagnostic Neuroimaging

## 2019-03-27 ENCOUNTER — Other Ambulatory Visit: Payer: Self-pay | Admitting: Diagnostic Neuroimaging

## 2019-03-27 NOTE — Telephone Encounter (Signed)
Pt called just to give a heads up that she put in a 90 day refill request for her Vimpat that she is running low on.

## 2019-03-30 ENCOUNTER — Telehealth: Payer: Self-pay | Admitting: *Deleted

## 2019-03-30 MED ORDER — VIMPAT 150 MG PO TABS: 1.0000 | ORAL_TABLET | Freq: Two times a day (BID) | ORAL | 0 refills | Status: DC

## 2019-03-30 NOTE — Telephone Encounter (Signed)
Received my chart:  I have been staying at my [facility] off and on all summer, and it turns out that I only grabbed two of the containers of my Vimpat (2 months worth) when we came here last. I have enough to last me until this coming Sunday 04/04/19, but we aren't going back to [place], until Friday 04/10/19. Can Dr. Leta Baptist send 5 day's worth of Vimpat (2 x 150 mg a day) to the [facility] in [place], [place]? That will get me through until we return home to [facility] and I can finish off my last 30 days of meds. Please let me know if this is possible, otherwise I will need to figure something else out by the end of the week.

## 2019-03-30 NOTE — Telephone Encounter (Signed)
Refill sent in by Dr Leta Baptist.

## 2019-04-20 ENCOUNTER — Other Ambulatory Visit: Payer: Self-pay | Admitting: Obstetrics and Gynecology

## 2019-04-20 DIAGNOSIS — N6310 Unspecified lump in the right breast, unspecified quadrant: Secondary | ICD-10-CM

## 2019-04-21 ENCOUNTER — Other Ambulatory Visit: Payer: BLUE CROSS/BLUE SHIELD

## 2019-05-04 ENCOUNTER — Other Ambulatory Visit: Payer: Self-pay | Admitting: Diagnostic Neuroimaging

## 2019-05-05 ENCOUNTER — Other Ambulatory Visit: Payer: Self-pay

## 2019-05-05 ENCOUNTER — Ambulatory Visit: Payer: BC Managed Care – PPO | Admitting: Diagnostic Neuroimaging

## 2019-05-05 ENCOUNTER — Encounter: Payer: Self-pay | Admitting: Diagnostic Neuroimaging

## 2019-05-05 VITALS — BP 94/62 | HR 72 | Temp 97.8°F | Ht 64.0 in | Wt 119.0 lb

## 2019-05-05 DIAGNOSIS — G40909 Epilepsy, unspecified, not intractable, without status epilepticus: Secondary | ICD-10-CM

## 2019-05-05 DIAGNOSIS — G40109 Localization-related (focal) (partial) symptomatic epilepsy and epileptic syndromes with simple partial seizures, not intractable, without status epilepticus: Secondary | ICD-10-CM

## 2019-05-05 MED ORDER — ZONISAMIDE 100 MG PO CAPS: 100.0000 mg | ORAL_CAPSULE | Freq: Every day | ORAL | 4 refills | Status: DC

## 2019-05-05 MED ORDER — VIMPAT 150 MG PO TABS: 1.0000 | ORAL_TABLET | Freq: Two times a day (BID) | ORAL | 0 refills | Status: DC

## 2019-05-05 MED ORDER — VIMPAT 150 MG PO TABS: 1.0000 | ORAL_TABLET | Freq: Two times a day (BID) | ORAL | 4 refills | Status: DC

## 2019-05-05 NOTE — Progress Notes (Signed)
[facility]  PATIENT: Beth Vasquez DOB: 1980/09/14  REFERRING CLINICIAN:  HISTORY FROM: patient  REASON FOR VISIT: follow up    HISTORICAL  CHIEF COMPLAINT:  Chief Complaint  Patient presents with  . Follow-up    no seizures since last visit  . Room 7    here alone, has mask on    HISTORY OF PRESENT ILLNESS:   UPDATE (05/05/19, VRP): Since last visit, doing well. Symptoms are stable. No seizures or auras. No alleviating or aggravating factors. Tolerating meds.    UPDATE (09/09/18, VRP): Since last visit, doing well. Now on improved exercise program and feels much better. Also improving her nutrition. Memory lapses are stable. Aphasia attacks have stopped. Tolerating meds. No major seizures.    UPDATE (03/04/18, VRP): Since last visit, doing patient continues to have one spell per month of transient aphasia lasting less than 1 minute. Symptoms are moderate.  Patient does note persistent mild hair loss.  She also notes ongoing slightly worsening memory loss in conversations with family and friends.  Patient notes that carbohydrate intake and rest affect the frequency of these spells.  No other alleviating or aggravating factors. Tolerating Vimpat and zonisamide.  UPDATE 02/15/17: Since last visit patient doing well. No major seizures. Patient has rare aphasia spells if she increases her carbohydrate intake or is not get proper rest. Patient has noticed more hair loss in the last 1 year.  UPDATE 02/15/16: Since last visit, doing well. Seizures are less frequent (more than 1 month per event). Hydration, low carbs, rest seem to help avoid seizures. Tolerating meds. No kidney stone issues. Has gained 10lbs which was desirable for patient.   UPDATE 07/07/15: Since last visit, still with small clusters of aphasia events (3-4 events in a day; once per month). Overall stable. Had kidney stone (severe) August 2016, went to ER. No recurrent stones.   UPDATE 12/28/14: Since  last visit, doing much better. Went to [facility] for second opinion, continued vimpat, and then LEV was switched to [facility]. A few months ago, tried cutting out sugars/carb to lose some weight, and noticed that her seizures almost stopped. Now occ if she has sugars for 2-3 days, then she may have a few breakthrough partial seizures (aphasia x 20 sec).   UPDATE 11/03/13: Since last visit, continued on LTG 100mg  BID, but started having anxiety attacks and "disturbing" images in her mind (knife etc). She was advised to taper off LTG by my colleagues in my absence. Since that time, she has come down to LTG 25mg  BID, and the anxiety / images have subsided. Now her aphasia / partial seizures are back up to 2 per day. No grand mal seizures since 2012.  UPDATE 10/14/13: Since last visit, tried vimpat but was too expensive. Now on LTG 100mg  BID, and doing better. At LTG 75mg  BID, was event free x 2 weeks. Now on LTG 100mg  BID x 2 weeks, and only had 1 event yesterday. Events are consistent with 30 seconds of exp and receptive aphasia. No confusion, convulsions or staring. Able to walk, move and function. She is awake/aware when these are occuring.   UPDATE 07/22/13 (LL): Patient returns for earlier revisit due to increased partial seizures. She has almost daily episodes of being unable to speak and staring, but knows what is going on around her. Other times she gets an aura of a seizure, with a deja vu feeling, but then it never happens. Sometimes this is followed by headache and tiredness. Her  dose of Keppra is 250 mg in the morning and 500 mg at night. At higher dose she states she had depression, worse ringing in the ears and hair falling out. Her last EEG was in [place] and was video-monitored. She thinks her last MRI was 2 years ago.   PRIOR HPI 02/04/13 (VP): 38 year old right-handed female here for evaluation of seizure disorder. Patient was born full-term, no complications with normal development. Family history seizure  only notable in patient's mother's uncle. Patient has had 2 episodes of seizure in her life on 01/04/11 and 03/20/11. First seizure, patient was in the car with her husband and suddenly became unresponsive without warning. She has generalized convulsions. Took her about 20 minutes to regain consciousness. She was slowing confused for several hours. She bit her tongue but did not have incontinence. Second episode occurred in a similar fashion to the first. Following the second seizure patient started on levetiracetam 500 mg twice a day. Within a few months, patient was complaining of depression and being in the ears as well as hair loss and therefore levetiracetam dose was decreased to 500 mg the morning and 500 mg at night. Around this time patient began to have intermittent episodes lasting 1 minute at a time, of expressive and receptive aphasia. She did not lose consciousness. She is able to stand up sit down involuntary move her arms and legs. Sometimes she is able to get one or 2 words out. These events occur once per week, in clusters of up to 5 in a day.  Patient had video EEG monitoring in [place], which she thinks showed left frontal epileptiform discharges. Patient was pregnant in 2013, and stayed on levetiracetam during the pregnancy. She had no seizures during pregnancy. She gave birth to 2 healthy twin girls, who are now 60 months old. Patient reports that she has not had significant seizures in about two years. Recently, she has been having seizures and would like to be assessed and medication changes made as appropriate.   REVIEW OF SYSTEMS: Full 14 system review of systems performed and negative except: as per HPI.   ALLERGIES: Allergies  Allergen Reactions  . No Known Allergies     HOME MEDICATIONS: Outpatient Medications Prior to Visit  Medication Sig Dispense Refill  . BIOTIN PO Take by mouth.    . Multiple Vitamins-Minerals (WOMENS MULTIVITAMIN PLUS PO) Take 1 tablet by mouth daily.     Marland Kitchen VIMPAT 150 MG TABS TAKE 1 TABLET TWICE A DAY 180 tablet 1  . zonisamide (ZONEGRAN) 100 MG capsule Take 1 capsule (100 mg total) by mouth at bedtime. 90 capsule 4   No facility-administered medications prior to visit.     PAST MEDICAL HISTORY: Past Medical History:  Diagnosis Date  . Acid reflux   . Kidney stone   . Seizures ([place])     PAST SURGICAL HISTORY: Past Surgical History:  Procedure Laterality Date  . CESAREAN SECTION    . HYSTEROSCOPY  08/2011    FAMILY HISTORY: Family History  Problem Relation Age of Onset  . Breast cancer Mother   . Seizures Maternal Uncle   . Hypertension Brother     SOCIAL HISTORY:  Social History   Socioeconomic History  . Marital status: Married    Spouse name: [name]  . Number of children: 2  . Years of education: College  . Highest education level: Not on file  Occupational History  . Occupation: N/A  Social Needs  . Financial resource strain: Not on  file  . Food insecurity    Worry: Not on file    Inability: Not on file  . Transportation needs    Medical: Not on file    Non-medical: Not on file  Tobacco Use  . Smoking status: Never Smoker  . Smokeless tobacco: Never Used  Substance and Sexual Activity  . Alcohol use: Yes    Alcohol/week: 1.0 standard drinks    Types: 1 Glasses of wine per week    Comment: occasionally  . Drug use: No  . Sexual activity: Not on file  Lifestyle  . Physical activity    Days per week: Not on file    Minutes per session: Not on file  . Stress: Not on file  Relationships  . Social Herbalist on phone: Not on file    Gets together: Not on file    Attends religious service: Not on file    Active member of club or organization: Not on file    Attends meetings of clubs or organizations: Not on file    Relationship status: Not on file  . Intimate partner violence    Fear of current or ex partner: Not on file    Emotionally abused: Not on file    Physically abused: Not on file     Forced sexual activity: Not on file  Other Topics Concern  . Not on file  Social History Narrative   Patient lives at home with family.   Caffeine Use: 1 tea or coffee daily in a.m     PHYSICAL EXAM  Vitals:   05/05/19 0835  BP: 94/62  Pulse: 72  Temp: 97.8 F (36.6 C)  Weight: 119 lb (54 kg)  Height: 5\' 4"  (1.626 m)    Not recorded     Wt Readings from Last 3 Encounters:  05/05/19 119 lb (54 kg)  09/09/18 119 lb 6.4 oz (54.2 kg)  03/04/18 123 lb (55.8 kg)   Body mass index is 20.43 kg/m.   GENERAL EXAM/CONSTITUTIONAL:  Vitals: as above  Patient is in no distress; well developed, nourished and groomed; neck is supple  CARDIOVASCULAR:  Examination of carotid arteries is normal; no carotid bruits  Regular rate and rhythm, no murmurs  Examination of peripheral vascular system by observation and palpation is normal   MUSCULOSKELETAL:  Gait, strength, tone, movements noted in Neurologic exam below  NEUROLOGIC: MENTAL STATUS:   awake, alert  recent and remote memory intact  normal attention and concentration  language fluent, comprehension intact, naming intact,   fund of knowledge appropriate  CRANIAL NERVE:   2nd, 3rd, 4th, 6th - pupils equal and reactive to light, visual fields full to confrontation, extraocular muscles intact, no nystagmus  5th - facial sensation symmetric  7th - facial strength symmetric  8th - hearing intact  9th - palate elevates symmetrically, uvula midline  11th - shoulder shrug symmetric  12th - tongue protrusion midline  MOTOR:   normal bulk and tone, full strength in the BUE, BLE  SENSORY:   normal and symmetric to light touch, temperature, vibration  COORDINATION:   finger-nose-finger, fine finger movements normal  REFLEXES:   deep tendon reflexes present and symmetric  GAIT/STATION:   narrow based gait    DIAGNOSTIC DATA (LABS, IMAGING, TESTING) - I reviewed patient records, labs,  notes, testing and imaging myself where available.  Lab Results  Component Value Date   WBC 13.8 (H) 02/21/2015      Component Value Date/Time  NA 140 02/21/2015 1030   No results found for: CHOL No results found for: HGBA1C No results found for: VITAMINB12 No results found for: TSH   11/16/11 - 11/17/11 VEEG ([facility], report) - left anterior temporal spike, slow wave discharges and intermittent focal slowing over left temporal region.    ASSESSMENT AND PLAN  38 y.o. 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 However, now having intermittent partial seizures (1-2 aphasia spells per day, 30-60 seconds at a time) without motor symptoms or alteration of consciousness. Was doing well on LEV 250 / 500 + LTG 100 BID, but then had side effects from LTG (anxiety / mood / disturbing mental images).   Was doing better on vimpat, zonisamide and sugar/carb restriction. Then had kidney stone in August 2016. Now on higher vimpat and decreased ZNG, and doing better.    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[place])    PLAN:  SEIZURE DISORDER (improved) - continue vimpat 150mg  twice a day + zonisamide 100mg  daily  - In future we may consider medication adjustment if hair loss or other side effects increase: Could consider increasing Vimpat and decreasing zonisamide, or changing zonisamide to an alternate medication (such as carbamazepine, oxcarbazepine, gabapentin, onfi) - ok to limit excessive simple carbs/sugars from diet - ok to drive from my standpoint (no convulsive seizures since 2012; she is compliant with medications; current spells/seizures are partial only, aphasia only, and do not affect her ability to drive)  Meds ordered this encounter  Medications  . DISCONTD: Lacosamide (VIMPAT) 150 MG TABS    Sig: Take 1 tablet (150 mg total)  by mouth 2 (two) times daily.    Dispense:  60 tablet    Refill:  0  . Lacosamide (VIMPAT) 150 MG TABS    Sig: Take 1 tablet (150 mg total) by mouth 2 (two) times daily.    Dispense:  180 tablet    Refill:  4  . zonisamide (ZONEGRAN) 100 MG capsule    Sig: Take 1 capsule (100 mg total) by mouth at bedtime.    Dispense:  90 capsule    Refill:  4   Return in about 1 year (around 05/04/2020).    Penni Bombard, MD [id number], [id number] AM Certified in Neurology, Neurophysiology and Neuroimaging  [facility] [address] [place], [place] [address] [phone]

## 2019-05-15 ENCOUNTER — Other Ambulatory Visit: Payer: BLUE CROSS/BLUE SHIELD

## 2019-06-03 ENCOUNTER — Ambulatory Visit
Admission: RE | Admit: 2019-06-03 | Discharge: 2019-06-03 | Disposition: A | Discharge status: Home / Self Care | Payer: BLUE CROSS/BLUE SHIELD | Source: Ambulatory Visit | Attending: Obstetrics and Gynecology | Admitting: Obstetrics and Gynecology

## 2019-06-03 ENCOUNTER — Other Ambulatory Visit: Payer: Self-pay

## 2019-06-03 ENCOUNTER — Other Ambulatory Visit: Payer: Self-pay | Admitting: Obstetrics and Gynecology

## 2019-06-03 DIAGNOSIS — N6310 Unspecified lump in the right breast, unspecified quadrant: Secondary | ICD-10-CM

## 2019-09-18 ENCOUNTER — Ambulatory Visit: Payer: 59 | Attending: Internal Medicine

## 2019-09-18 DIAGNOSIS — Z23 Encounter for immunization: Secondary | ICD-10-CM

## 2019-09-18 NOTE — Progress Notes (Signed)
   Covid-19 Vaccination Clinic  Name:  Teva Speake    MRN: [id number] DOB: 1981-05-10  09/18/2019  Ms. First was observed post Covid-19 immunization for 15 minutes without incident. She was provided with Vaccine Information Sheet and instruction to access the V-Safe system.   Ms. Gubler was instructed to call 911 with any severe reactions post vaccine: Marland Kitchen Difficulty breathing  . Swelling of face and throat  . A fast heartbeat  . A bad rash all over body  . Dizziness and weakness

## 2019-10-04 ENCOUNTER — Other Ambulatory Visit: Payer: Self-pay | Admitting: Diagnostic Neuroimaging

## 2019-10-08 ENCOUNTER — Other Ambulatory Visit: Payer: 59

## 2019-10-09 ENCOUNTER — Other Ambulatory Visit: Payer: 59

## 2019-10-20 ENCOUNTER — Ambulatory Visit: Payer: 59 | Attending: Internal Medicine

## 2019-10-20 DIAGNOSIS — Z23 Encounter for immunization: Secondary | ICD-10-CM

## 2019-10-20 NOTE — Progress Notes (Signed)
   Covid-19 Vaccination Clinic  Name:  Beth Vasquez    MRN: [id number] DOB: 10/28/1980  10/20/2019  [name] was observed post Covid-19 immunization for 15 minutes without incident. She was provided with Vaccine Information Sheet and instruction to access the V-Safe system.   [name] was instructed to call 911 with any severe reactions post vaccine: Marland Kitchen Difficulty breathing  . Swelling of face and throat  . A fast heartbeat  . A bad rash all over body  . Dizziness and weakness   Immunizations Administered    Name Date Dose VIS Date Route   Pfizer COVID-19 Vaccine 10/20/2019  1:40 PM 0.3 mL 06/26/2019 Intramuscular   Manufacturer: Gray   Lot: [id number]   [place]: [id number]

## 2019-10-21 ENCOUNTER — Other Ambulatory Visit: Payer: 59

## 2019-10-21 ENCOUNTER — Ambulatory Visit: Payer: 59 | Attending: Internal Medicine

## 2019-10-21 DIAGNOSIS — Z20822 Contact with and (suspected) exposure to covid-19: Secondary | ICD-10-CM

## 2019-10-22 LAB — NOVEL CORONAVIRUS, NAA: SARS-CoV-2, NAA: NOT DETECTED

## 2019-10-22 LAB — SARS-COV-2, NAA 2 DAY TAT

## 2020-04-11 ENCOUNTER — Other Ambulatory Visit: Payer: Self-pay | Admitting: Diagnostic Neuroimaging

## 2020-04-30 IMAGING — MG MM  DIGITAL DIAGNOSTIC BREAST BILAT IMPLANT W/ TOMO W/ CAD
9 of 16 series · 9 of 40 positions shown · non-contrast
Comparison: None.

CLINICAL DATA: Patient complains of an intermittent palpable
abnormality in the right breast. She does not feel the abnormality
today. Patient's mother was diagnosed with breast cancer at the age
of 47.

EXAM:
DIGITAL DIAGNOSTIC BILATERAL MAMMOGRAM WITH IMPLANTS, CAD AND TOMO
ULTRASOUND RIGHT BREAST
The patient has retropectoral implants. Standard and implant
displaced views were performed.

[L MLO]
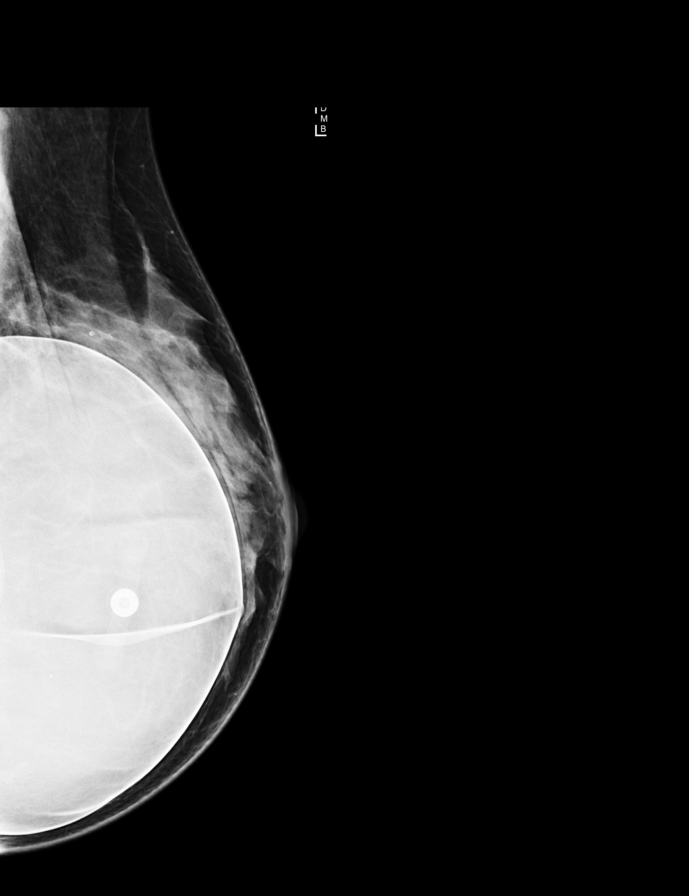

[R MLO]
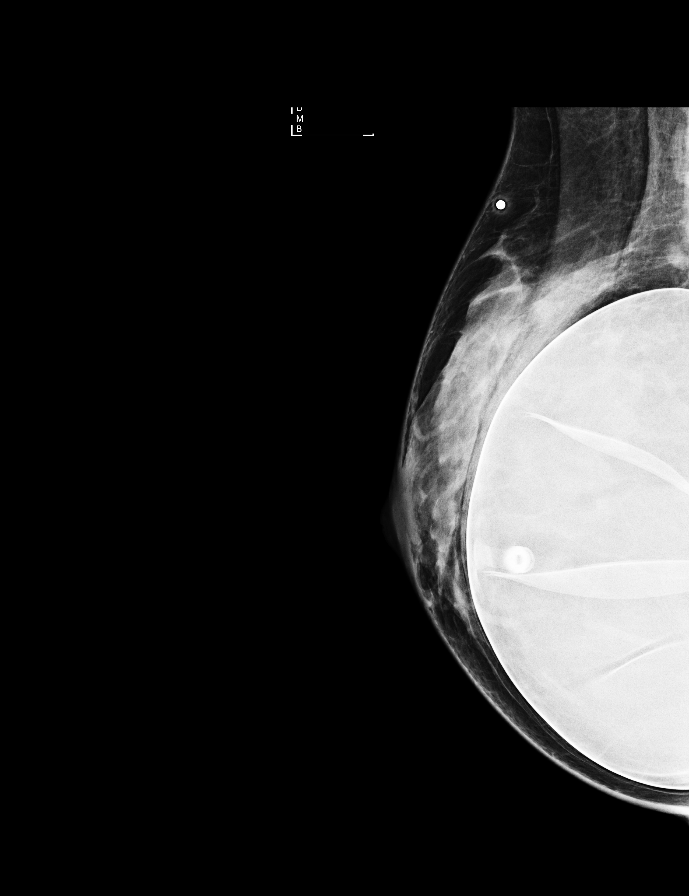

[R CC]
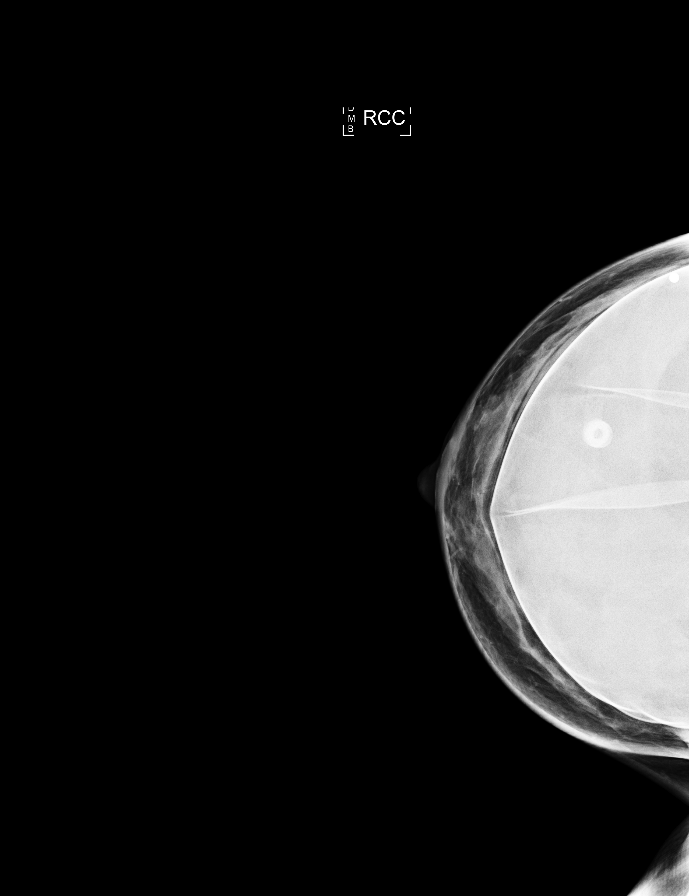

[L CC]
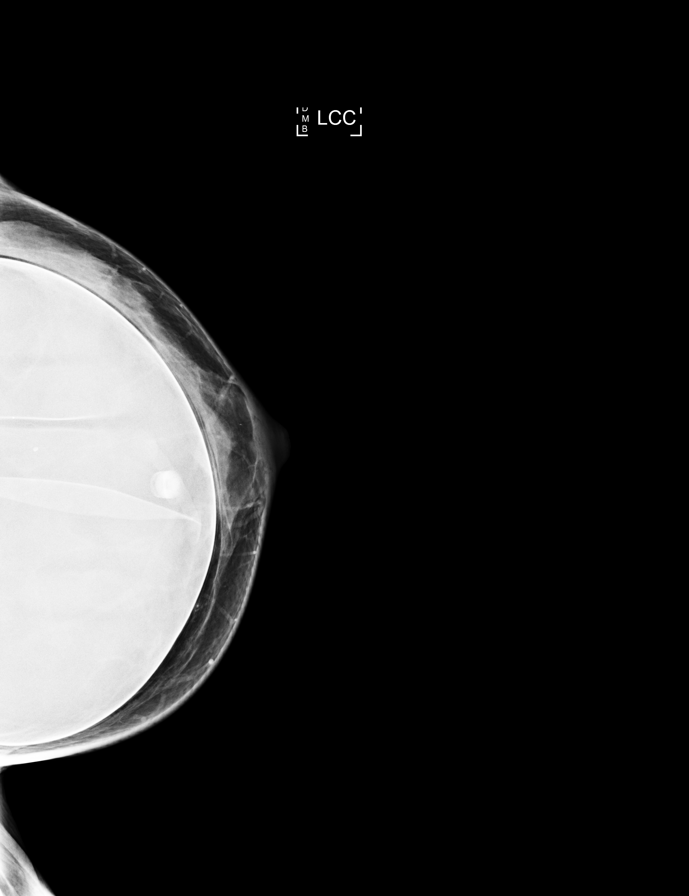

[R MLO synth-2D]
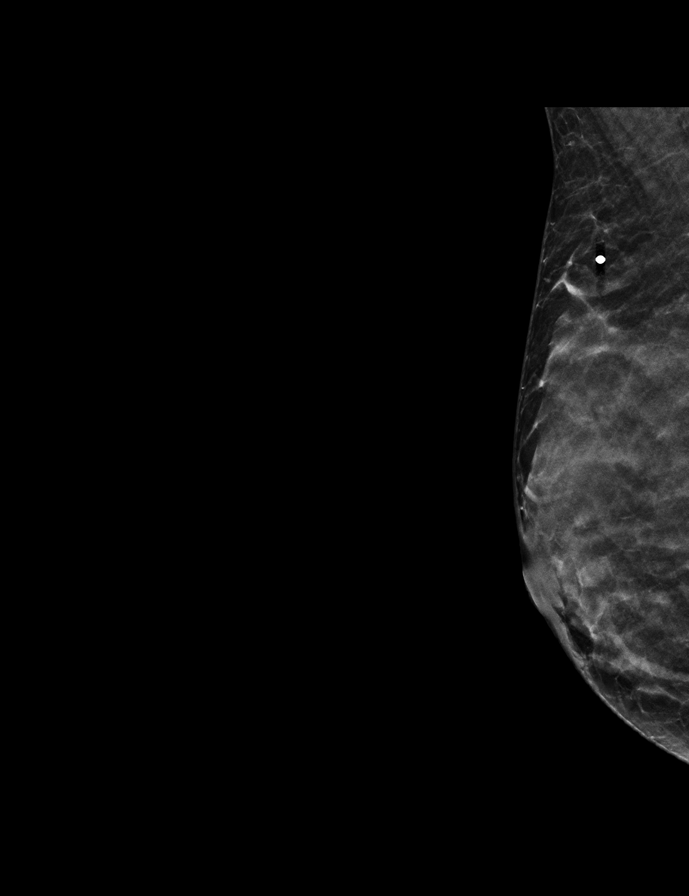

[L MLO synth-2D]
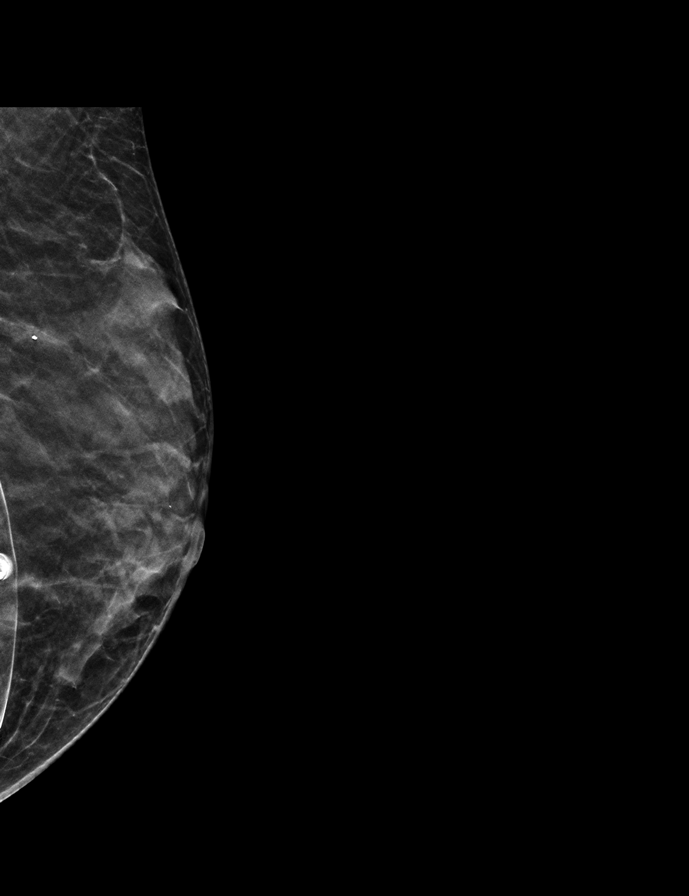

[R CC synth-2D]
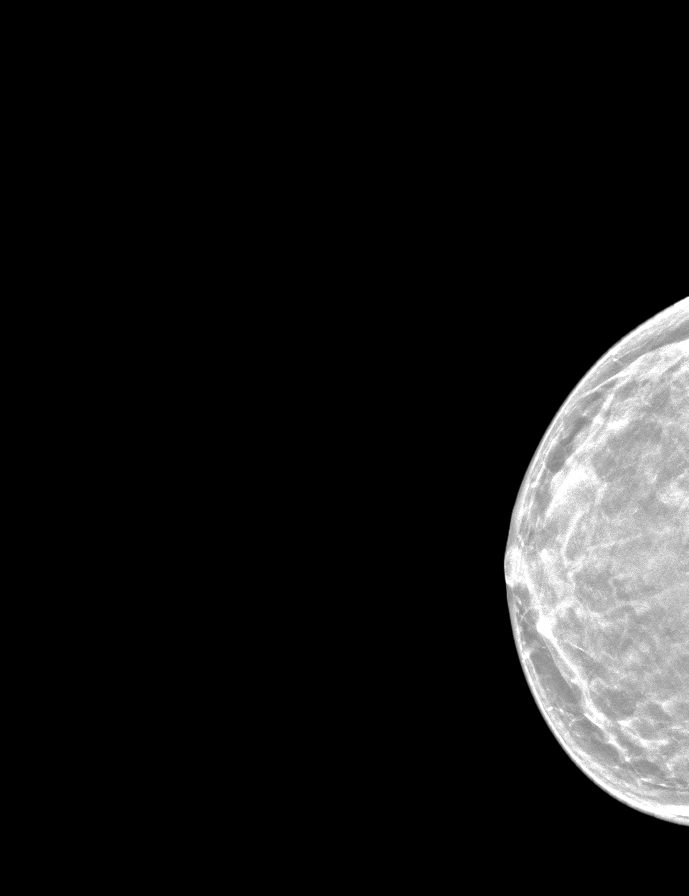

[R XCCL synth-2D]
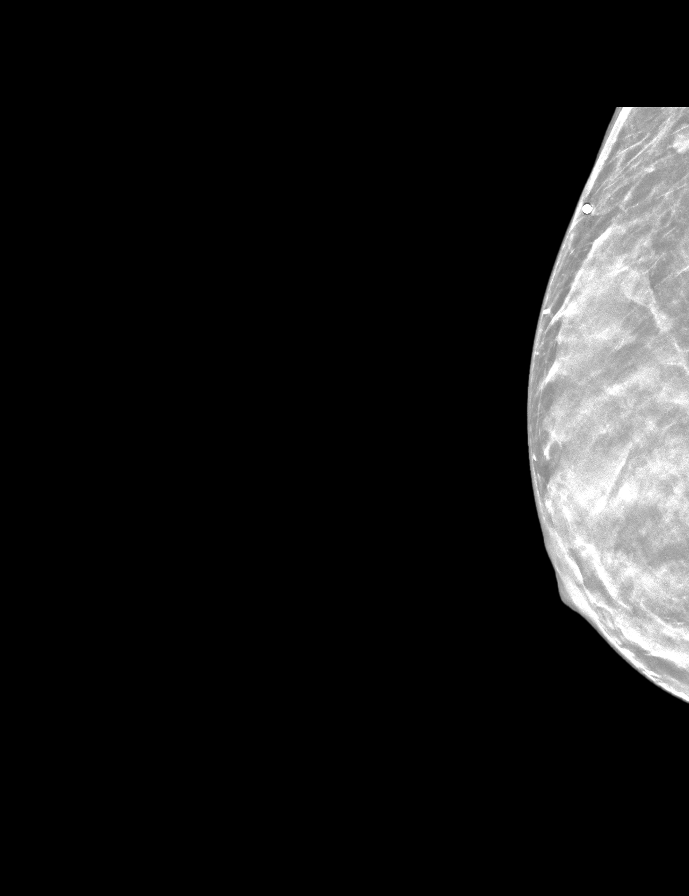

[R TAN synth-2D]
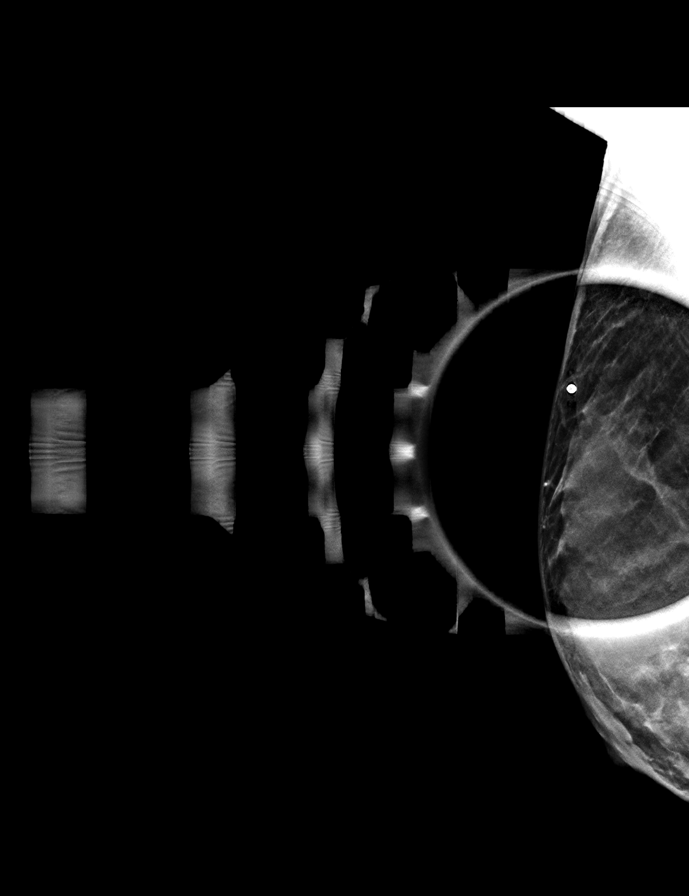

[9 of 40 positions shown; findings below may reference images not displayed]

ACR Breast Density Category c: The breast tissue is heterogeneously
dense, which may obscure small masses.
FINDINGS: No suspicious mass, malignant type microcalcifications or distortion
detected in either breast.

Targeted ultrasound is performed, showing normal tissue in the area
of clinical concern at 10 o'clock 5 cm from the nipple. No solid or
cystic mass, abnormal shadowing or distortion visualized.

Mammographic images were processed with CAD.
IMPRESSION: No evidence of malignancy in either breast.

RECOMMENDATION:
Bilateral screening mammogram in 1 year is recommended.

I have discussed the findings and recommendations with the patient.
If applicable, a reminder letter will be sent to the patient
regarding the next appointment.

BI-RADS CATEGORY  1: Negative.

## 2020-05-10 ENCOUNTER — Encounter: Payer: Self-pay | Admitting: Diagnostic Neuroimaging

## 2020-05-10 ENCOUNTER — Ambulatory Visit: Payer: No Typology Code available for payment source | Admitting: Diagnostic Neuroimaging

## 2020-05-10 VITALS — BP 101/73 | HR 82 | Ht 64.0 in | Wt 128.0 lb

## 2020-05-10 DIAGNOSIS — G40909 Epilepsy, unspecified, not intractable, without status epilepticus: Secondary | ICD-10-CM

## 2020-05-10 DIAGNOSIS — G40109 Localization-related (focal) (partial) symptomatic epilepsy and epileptic syndromes with simple partial seizures, not intractable, without status epilepticus: Secondary | ICD-10-CM | POA: Diagnosis not present

## 2020-05-10 MED ORDER — ZONISAMIDE 100 MG PO CAPS
100.0000 mg | ORAL_CAPSULE | Freq: Every day | ORAL | 4 refills | Status: DC
Start: 2020-05-10 — End: 2020-07-27

## 2020-05-10 MED ORDER — VIMPAT 150 MG PO TABS
1.0000 | ORAL_TABLET | Freq: Two times a day (BID) | ORAL | 4 refills | Status: DC
Start: 2020-05-10 — End: 2020-07-27

## 2020-05-10 NOTE — Progress Notes (Signed)
[facility]  PATIENT: Beth Vasquez DOB: 1981/02/28  REFERRING CLINICIAN:  HISTORY FROM: patient  REASON FOR VISIT: follow up    HISTORICAL  CHIEF COMPLAINT:  Chief Complaint  Patient presents with  . Seizures    rm 7, one year FU "no seizures"    HISTORY OF PRESENT ILLNESS:   UPDATE (05/10/20, VRP): Since last visit, doing well. No seizures. 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 Now back to baseline. Tolerating meds.   UPDATE (05/05/19, VRP): Since last visit, doing well. Symptoms are stable. No seizures or auras. No alleviating or aggravating factors. Tolerating meds.    UPDATE (09/09/18, VRP): Since last visit, doing well. Now on improved exercise program and feels much better. Also improving her nutrition. Memory lapses are stable. Aphasia attacks have stopped. Tolerating meds. No major seizures.    UPDATE (03/04/18, VRP): Since last visit, doing patient continues to have one spell per month of transient aphasia lasting less than 1 minute. Symptoms are moderate.  Patient does note persistent mild hair loss.  She also notes ongoing slightly worsening memory loss in conversations with family and friends.  Patient notes that carbohydrate intake and rest affect the frequency of these spells.  No other alleviating or aggravating factors. Tolerating Vimpat and zonisamide.  UPDATE 02/15/17: Since last visit patient doing well. No major seizures. Patient has rare aphasia spells if she increases her carbohydrate intake or is not get proper rest. Patient has noticed more hair loss in the last 1 year.  UPDATE 02/15/16: Since last visit, doing well. Seizures are less frequent (more than 1 month per event). Hydration, low carbs, rest seem to help avoid seizures. Tolerating meds. No kidney stone issues. Has gained 10lbs which was desirable for patient.   UPDATE 07/07/15: Since last visit, still  with small clusters of aphasia events (3-4 events in a day; once per month). Overall stable. Had kidney stone (severe) August 2016, went to ER. No recurrent stones.   UPDATE 12/28/14: Since last visit, doing much better. Went to [facility] for second opinion, continued vimpat, and then LEV was switched to [facility]. A few months ago, tried cutting out sugars/carb to lose some weight, and noticed that her seizures almost stopped. Now occ if she has sugars for 2-3 days, then she may have a few breakthrough partial seizures (aphasia x 20 sec).   UPDATE 11/03/13: Since last visit, continued on LTG 100mg  BID, but started having anxiety attacks and "disturbing" images in her mind (knife etc). She was advised to taper off LTG by my colleagues in my absence. Since that time, she has come down to LTG 25mg  BID, and the anxiety / images have subsided. Now her aphasia / partial seizures are back up to 2 per day. No grand mal seizures since 2012.  UPDATE 10/14/13: Since last visit, tried vimpat but was too expensive. Now on LTG 100mg  BID, and doing better. At LTG 75mg  BID, was event free x 2 weeks. Now on LTG 100mg  BID x 2 weeks, and only had 1 event yesterday. Events are consistent with 30 seconds of exp and receptive aphasia. No confusion, convulsions or staring. Able to walk, move and function. She is awake/aware when these are occuring.   UPDATE 07/22/13 (LL): Patient returns for earlier revisit due to increased partial seizures. She has almost daily episodes of being unable to speak and staring, but knows what is going  on around her. Other times she gets an aura of a seizure, with a deja vu feeling, but then it never happens. Sometimes this is followed by headache and tiredness. Her dose of Keppra is 250 mg in the morning and 500 mg at night. At higher dose she states she had depression, worse ringing in the ears and hair falling out. Her last EEG was in [place] and was video-monitored. She thinks her last MRI was 2 years ago.    PRIOR HPI 02/04/13 (VP): 39 year old right-handed female here for evaluation of seizure disorder. Patient was born full-term, no complications with normal development. Family history seizure only notable in patient's mother's uncle. Patient has had 2 episodes of seizure in her life on 01/04/11 and 03/20/11. First seizure, patient was in the car with her husband and suddenly became unresponsive without warning. She has generalized convulsions. Took her about 20 minutes to regain consciousness. She was slowing confused for several hours. She bit her tongue but did not have incontinence. Second episode occurred in a similar fashion to the first. Following the second seizure patient started on levetiracetam 500 mg twice a day. Within a few months, patient was complaining of depression and being in the ears as well as hair loss and therefore levetiracetam dose was decreased to 500 mg the morning and 500 mg at night. Around this time patient began to have intermittent episodes lasting 1 minute at a time, of expressive and receptive aphasia. She did not lose consciousness. She is able to stand up sit down involuntary move her arms and legs. Sometimes she is able to get one or 2 words out. These events occur once per week, in clusters of up to 5 in a day.  Patient had video EEG monitoring in [place], which she thinks showed left frontal epileptiform discharges. Patient was pregnant in 2013, and stayed on levetiracetam during the pregnancy. She had no seizures during pregnancy. She gave birth to 2 healthy twin girls, who are now 35 months old. Patient reports that she has not had significant seizures in about two years. Recently, she has been having seizures and would like to be assessed and medication changes made as appropriate.   REVIEW OF SYSTEMS: Full 14 system review of systems performed and negative except: as per HPI.   ALLERGIES: Allergies  Allergen Reactions  . No Known Allergies     HOME  MEDICATIONS: Outpatient Medications Prior to Visit  Medication Sig Dispense Refill  . Multiple Vitamins-Minerals (WOMENS MULTIVITAMIN PLUS PO) Take 1 tablet by mouth daily.    Marland Kitchen VIMPAT 150 MG TABS TAKE 1 TABLET TWICE A DAY 180 tablet 1  . zonisamide (ZONEGRAN) 100 MG capsule Take 1 capsule (100 mg total) by mouth at bedtime. 90 capsule 4  . BIOTIN PO Take by mouth. (Patient not taking: Reported on 05/10/2020)     No facility-administered medications prior to visit.    PAST MEDICAL HISTORY: Past Medical History:  Diagnosis Date  . Acid reflux   . Kidney stone   . Seizures ([place])     PAST SURGICAL HISTORY: Past Surgical History:  Procedure Laterality Date  . AUGMENTATION MAMMAPLASTY Bilateral    saline 2006  . CESAREAN SECTION    . HYSTEROSCOPY  08/2011    FAMILY HISTORY: Family History  Problem Relation Age of Onset  . Breast cancer Mother   . Seizures Maternal Uncle   . Hypertension Brother     SOCIAL HISTORY:  Social History   Socioeconomic History  .  Marital status: Married    Spouse name: [name]  . Number of children: 2  . Years of education: College  . Highest education level: Not on file  Occupational History  . Occupation: N/A  Tobacco Use  . Smoking status: Never Smoker  . Smokeless tobacco: Never Used  Vaping Use  . Vaping Use: Never used  Substance and Sexual Activity  . Alcohol use: Yes    Alcohol/week: 1.0 standard drink    Types: 1 Glasses of wine per week    Comment: occasionally  . Drug use: No  . Sexual activity: Not on file  Other Topics Concern  . Not on file  Social History Narrative   Patient lives at home with family.   Caffeine Use: 1 tea or coffee daily in a.m   Social Determinants of Health   Financial Resource Strain:   . Difficulty of Paying Living Expenses: Not on file  Food Insecurity:   . Worried About Charity fundraiser in the Last Year: Not on file  . Ran Out of Food in the Last Year: Not on file  Transportation  Needs:   . Lack of Transportation (Medical): Not on file  . Lack of Transportation (Non-Medical): Not on file  Physical Activity:   . Days of Exercise per Week: Not on file  . Minutes of Exercise per Session: Not on file  Stress:   . Feeling of Stress : Not on file  Social Connections:   . Frequency of Communication with Friends and Family: Not on file  . Frequency of Social Gatherings with Friends and Family: Not on file  . Attends Religious Services: Not on file  . Active Member of Clubs or Organizations: Not on file  . Attends Archivist Meetings: Not on file  . Marital Status: Not on file  Intimate Partner Violence:   . Fear of Current or Ex-Partner: Not on file  . Emotionally Abused: Not on file  . Physically Abused: Not on file  . Sexually Abused: Not on file     PHYSICAL EXAM  Vitals:   05/10/20 0834  BP: 101/73  Pulse: 82  Weight: 128 lb (58.1 kg)  Height: 5\' 4"  (1.626 m)   Not recorded    Wt Readings from Last 3 Encounters:  05/10/20 128 lb (58.1 kg)  05/05/19 119 lb (54 kg)  09/09/18 119 lb 6.4 oz (54.2 kg)   Body mass index is 21.97 kg/m.   GENERAL EXAM/CONSTITUTIONAL:  Vitals: as above  Patient is in no distress; well developed, nourished and groomed; neck is supple  CARDIOVASCULAR:  Examination of carotid arteries is normal; no carotid bruits  Regular rate and rhythm, no murmurs  Examination of peripheral vascular system by observation and palpation is normal   MUSCULOSKELETAL:  Gait, strength, tone, movements noted in Neurologic exam below  NEUROLOGIC: MENTAL STATUS:   awake, alert  recent and remote memory intact  normal attention and concentration  language fluent, comprehension intact, naming intact,   fund of knowledge appropriate  CRANIAL NERVE:   2nd, 3rd, 4th, 6th - pupils equal and reactive to light, visual fields full to confrontation, extraocular muscles intact, no nystagmus  5th - facial sensation  symmetric  7th - facial strength symmetric  8th - hearing intact  9th - palate elevates symmetrically, uvula midline  11th - shoulder shrug symmetric  12th - tongue protrusion midline  MOTOR:   normal bulk and tone, full strength in the BUE, BLE  SENSORY:  normal and symmetric to light touch, temperature, vibration  COORDINATION:   finger-nose-finger, fine finger movements normal  REFLEXES:   deep tendon reflexes present and symmetric  GAIT/STATION:   narrow based gait    DIAGNOSTIC DATA (LABS, IMAGING, TESTING) - I reviewed patient records, labs, notes, testing and imaging myself where available.  Lab Results  Component Value Date   WBC 13.8 (H) 02/21/2015      Component Value Date/Time   NA 140 02/21/2015 1030   No results found for: CHOL No results found for: HGBA1C No results found for: VITAMINB12 No results found for: TSH   11/16/11 - 11/17/11 VEEG ([facility], report) - left anterior temporal spike, slow wave discharges and intermittent focal slowing over left temporal region.    ASSESSMENT AND PLAN  39 y.o. 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 However, now having intermittent partial seizures (1-2 aphasia spells per day, 30-60 seconds at a time) without motor symptoms or alteration of consciousness. Was doing well on LEV 250 / 500 + LTG 100 BID, but then had side effects from LTG (anxiety / mood / disturbing mental images).   Was doing better on vimpat, zonisamide and sugar/carb restriction. Then had kidney stone in August 2016. Now on higher vimpat and decreased ZNG, and doing better.    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 Lacosamide (VIMPAT) 150 MG TABS    Sig: Take 1 tablet (150 mg total) by mouth 2 (two) times daily.    Dispense:  180 tablet    Refill:  4  . zonisamide (ZONEGRAN) 100 MG capsule    Sig: Take 1 capsule (100 mg total) by mouth at bedtime.    Dispense:  90 capsule    Refill:  4   Return in about 1 year (around 05/10/2021) for virtual visit (15 min), with NP (Amy Lomax).    Penni Bombard, MD 24/03/7352, 2:99 AM Certified in Neurology, Neurophysiology and Neuroimaging  [facility] [address], [place], [place] [address] [phone]

## 2020-07-16 HISTORY — PX: ABDOMINOPLASTY: SUR9

## 2020-07-27 ENCOUNTER — Telehealth: Payer: Self-pay | Admitting: *Deleted

## 2020-07-27 ENCOUNTER — Other Ambulatory Visit: Payer: Self-pay | Admitting: *Deleted

## 2020-07-27 DIAGNOSIS — G40109 Localization-related (focal) (partial) symptomatic epilepsy and epileptic syndromes with simple partial seizures, not intractable, without status epilepticus: Secondary | ICD-10-CM

## 2020-07-27 MED ORDER — ZONISAMIDE 100 MG PO CAPS: 100.0000 mg | ORAL_CAPSULE | Freq: Every day | ORAL | 0 refills | Status: DC

## 2020-07-27 MED ORDER — VIMPAT 150 MG PO TABS
1.0000 | ORAL_TABLET | Freq: Two times a day (BID) | ORAL | 4 refills | Status: DC
Start: 2020-07-27 — End: 2021-04-24

## 2020-07-27 NOTE — Telephone Encounter (Signed)
Meds ordered this encounter  Medications  . Lacosamide (VIMPAT) 150 MG TABS    Sig: Take 1 tablet (150 mg total) by mouth 2 (two) times daily.    Dispense:  180 tablet    Refill:  [place], MD 0/88/1103, 1:59 PM Certified in Neurology, Neurophysiology and Neuroimaging  [facility] [address] [place], [place] [address] [phone]

## 2020-07-27 NOTE — Telephone Encounter (Signed)
Patient sent message asking for Vimpat Rx to be sent to [facility] mail order [facility]. She stated she is having issues with [facility], wants to be sure she can get medicine  In time.

## 2020-07-27 NOTE — Telephone Encounter (Signed)
Advised her of Rx via my chart.

## 2021-04-24 ENCOUNTER — Other Ambulatory Visit: Payer: Self-pay | Admitting: Diagnostic Neuroimaging

## 2021-05-22 NOTE — Patient Instructions (Incomplete)

## 2021-05-22 NOTE — Progress Notes (Deleted)
PATIENT: Beth Vasquez DOB: 03-29-1981  REASON FOR VISIT: follow up HISTORY FROM: patient  Virtual Visit via Telephone Note  I connected with Beth Vasquez on 05/22/21 at 10:15 AM EST by telephone and verified that I am speaking with the correct person using two identifiers.   I discussed the limitations, risks, security and privacy concerns of performing an evaluation and management service by telephone and the availability of in person appointments. I also discussed with the patient that there may be a patient responsible charge related to this service. The patient expressed understanding and agreed to proceed.   History of Present Illness:  05/22/21 ALL: Beth Vasquez is a 40 y.o. female here today for seizure follow up. She continues lacosamide 150mg  BID and zonisamide 100mg  at bedtime. Last GTC seizure in 2012.    History (copied from Dr Beth Vasquez previous note)  UPDATE (05/10/20, VRP): Since last visit, doing well. No seizures. 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 Now back to baseline. Tolerating meds.    UPDATE (05/05/19, VRP): Since last visit, doing well. Symptoms are stable. No seizures or auras. No alleviating or aggravating factors. Tolerating meds.     UPDATE (09/09/18, VRP): Since last visit, doing well. Now on improved exercise program and feels much better. Also improving her nutrition. Memory lapses are stable. Aphasia attacks have stopped. Tolerating meds. No major seizures.     UPDATE (03/04/18, VRP): Since last visit, doing patient continues to have one spell per month of transient aphasia lasting less than 1 minute. Symptoms are moderate.  Patient does note persistent mild hair loss.  She also notes ongoing slightly worsening memory loss in conversations with family and friends.  Patient notes that carbohydrate intake and rest affect the frequency of these spells.  No other  alleviating or aggravating factors. Tolerating Vimpat and zonisamide.   UPDATE 02/15/17: Since last visit patient doing well. No major seizures. Patient has rare aphasia spells if she increases her carbohydrate intake or is not get proper rest. Patient has noticed more hair loss in the last 1 year.   UPDATE 02/15/16: Since last visit, doing well. Seizures are less frequent (more than 1 month per event). Hydration, low carbs, rest seem to help avoid seizures. Tolerating meds. No kidney stone issues. Has gained 10lbs which was desirable for patient.    UPDATE 07/07/15: Since last visit, still with small clusters of aphasia events (3-4 events in a day; once per month). Overall stable. Had kidney stone (severe) August 2016, went to ER. No recurrent stones.    UPDATE 12/28/14: Since last visit, doing much better. Went to [facility] for second opinion, continued vimpat, and then LEV was switched to [facility]. A few months ago, tried cutting out sugars/carb to lose some weight, and noticed that her seizures almost stopped. Now occ if she has sugars for 2-3 days, then she may have a few breakthrough partial seizures (aphasia x 20 sec).    UPDATE 11/03/13: Since last visit, continued on LTG 100mg  BID, but started having anxiety attacks and "disturbing" images in her mind (knife etc). She was advised to taper off LTG by my colleagues in my absence. Since that time, she has come down to LTG 25mg  BID, and the anxiety / images have subsided. Now her aphasia / partial seizures are back up to 2 per day. No grand mal seizures since 2012.   UPDATE 10/14/13: Since last visit,  tried vimpat but was too expensive. Now on LTG 100mg  BID, and doing better. At LTG 75mg  BID, was event free x 2 weeks. Now on LTG 100mg  BID x 2 weeks, and only had 1 event yesterday. Events are consistent with 30 seconds of exp and receptive aphasia. No confusion, convulsions or staring. Able to walk, move and function. She is awake/aware when these are occuring.     UPDATE 07/22/13 (LL): Patient returns for earlier revisit due to increased partial seizures. She has almost daily episodes of being unable to speak and staring, but knows what is going on around her. Other times she gets an aura of a seizure, with a deja vu feeling, but then it never happens. Sometimes this is followed by headache and tiredness. Her dose of Keppra is 250 mg in the morning and 500 mg at night. At higher dose she states she had depression, worse ringing in the ears and hair falling out. Her last EEG was in [place] and was video-monitored. She thinks her last MRI was 2 years ago.    PRIOR HPI 02/04/13 (VP): 39 year old right-handed female here for evaluation of seizure disorder. Patient was born full-term, no complications with normal development. Family history seizure only notable in patient's mother's uncle. Patient has had 2 episodes of seizure in her life on 01/04/11 and 03/20/11. First seizure, patient was in the car with her husband and suddenly became unresponsive without warning. She has generalized convulsions. Took her about 20 minutes to regain consciousness. She was slowing confused for several hours. She bit her tongue but did not have incontinence. Second episode occurred in a similar fashion to the first. Following the second seizure patient started on levetiracetam 500 mg twice a day. Within a few months, patient was complaining of depression and being in the ears as well as hair loss and therefore levetiracetam dose was decreased to 500 mg the morning and 500 mg at night. Around this time patient began to have intermittent episodes lasting 1 minute at a time, of expressive and receptive aphasia. She did not lose consciousness. She is able to stand up sit down involuntary move her arms and legs. Sometimes she is able to get one or 2 words out. These events occur once per week, in clusters of up to 5 in a day.  Patient had video EEG monitoring in [place], which she thinks showed left  frontal epileptiform discharges. Patient was pregnant in 2013, and stayed on levetiracetam during the pregnancy. She had no seizures during pregnancy. She gave birth to 2 healthy twin girls, who are now 24 months old. Patient reports that she has not had significant seizures in about two years. Recently, she has been having seizures and would like to be assessed and medication changes made as appropriate.    Observations/Objective:  Generalized: Well developed, in no acute distress  Mentation: Alert oriented to time, place, history taking. Follows all commands speech and language fluent   Assessment and Plan:  40 y.o. year old female  has a past medical history of Acid reflux, Kidney stone, and Seizures ([place]). here with  No diagnosis found.  No orders of the defined types were placed in this encounter.   No orders of the defined types were placed in this encounter.    Follow Up Instructions:  I discussed the assessment and treatment plan with the patient. The patient was provided an opportunity to ask questions and all were answered. The patient agreed with the plan and demonstrated an understanding  of the instructions.   The patient was advised to call back or seek an in-person evaluation if the symptoms worsen or if the condition fails to improve as anticipated.  I provided *** minutes of non-face-to-face time during this encounter. Patient located at their place of residence during [place] visit. Provider is in the office.    Debbora Presto, NP

## 2021-05-23 ENCOUNTER — Telehealth: Payer: No Typology Code available for payment source | Admitting: Family Medicine

## 2021-05-24 ENCOUNTER — Telehealth: Payer: Self-pay | Admitting: Family Medicine

## 2021-05-24 ENCOUNTER — Telehealth: Payer: No Typology Code available for payment source | Admitting: Family Medicine

## 2021-05-24 DIAGNOSIS — G40109 Localization-related (focal) (partial) symptomatic epilepsy and epileptic syndromes with simple partial seizures, not intractable, without status epilepticus: Secondary | ICD-10-CM

## 2021-05-24 NOTE — Telephone Encounter (Signed)
Appt cx due to Amy out sick.

## 2021-06-29 NOTE — Progress Notes (Signed)
PATIENT: Beth Vasquez DOB: 30-May-1981  REASON FOR VISIT: follow up HISTORY FROM: patient  Virtual Visit via Telephone Note  I connected with Beth Vasquez on 07/04/21 at  7:30 AM EST by telephone and verified that I am speaking with the correct person using two identifiers.   I discussed the limitations, risks, security and privacy concerns of performing an evaluation and management service by telephone and the availability of in person appointments. I also discussed with the patient that there may be a patient responsible charge related to this service. The patient expressed understanding and agreed to proceed.   History of Present Illness:  07/04/21 ALL: Beth Vasquez is a 40 y.o. female here today for follow up for seizures. Last GTC seizure 2012. Partial seizure (aphasia) stable. She continues to tolerate lacosamide 150mg  BID and zonisamide 100mg  QHS. She is doing well. No recent events of aphasia. She feels events were associated with prolonged study times. She is tolerating medications. No concerns.   History (copied from Dr Gladstone Lighter previous note)  UPDATE (05/10/20, VRP): Since last visit, doing well. No seizures. 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 Now back to baseline. Tolerating meds.    UPDATE (05/05/19, VRP): Since last visit, doing well. Symptoms are stable. No seizures or auras. No alleviating or aggravating factors. Tolerating meds.     UPDATE (09/09/18, VRP): Since last visit, doing well. Now on improved exercise program and feels much better. Also improving her nutrition. Memory lapses are stable. Aphasia attacks have stopped. Tolerating meds. No major seizures.     UPDATE (03/04/18, VRP): Since last visit, doing patient continues to have one spell per month of transient aphasia lasting less than 1 minute. Symptoms are moderate.  Patient does note persistent mild hair loss.  She also  notes ongoing slightly worsening memory loss in conversations with family and friends.  Patient notes that carbohydrate intake and rest affect the frequency of these spells.  No other alleviating or aggravating factors. Tolerating Vimpat and zonisamide.   UPDATE 02/15/17: Since last visit patient doing well. No major seizures. Patient has rare aphasia spells if she increases her carbohydrate intake or is not get proper rest. Patient has noticed more hair loss in the last 1 year.   UPDATE 02/15/16: Since last visit, doing well. Seizures are less frequent (more than 1 month per event). Hydration, low carbs, rest seem to help avoid seizures. Tolerating meds. No kidney stone issues. Has gained 10lbs which was desirable for patient.    UPDATE 07/07/15: Since last visit, still with small clusters of aphasia events (3-4 events in a day; once per month). Overall stable. Had kidney stone (severe) August 2016, went to ER. No recurrent stones.    UPDATE 12/28/14: Since last visit, doing much better. Went to [facility] for second opinion, continued vimpat, and then LEV was switched to [facility]. A few months ago, tried cutting out sugars/carb to lose some weight, and noticed that her seizures almost stopped. Now occ if she has sugars for 2-3 days, then she may have a few breakthrough partial seizures (aphasia x 20 sec).    UPDATE 11/03/13: Since last visit, continued on LTG 100mg  BID, but started having anxiety attacks and "disturbing" images in her mind (knife etc). She was advised to taper off LTG by my colleagues in my absence. Since that time, she has come down to LTG 25mg  BID, and the anxiety /  images have subsided. Now her aphasia / partial seizures are back up to 2 per day. No grand mal seizures since 2012.   UPDATE 10/14/13: Since last visit, tried vimpat but was too expensive. Now on LTG 100mg  BID, and doing better. At LTG 75mg  BID, was event free x 2 weeks. Now on LTG 100mg  BID x 2 weeks, and only had 1 event yesterday.  Events are consistent with 30 seconds of exp and receptive aphasia. No confusion, convulsions or staring. Able to walk, move and function. She is awake/aware when these are occuring.    UPDATE 07/22/13 (LL): Patient returns for earlier revisit due to increased partial seizures. She has almost daily episodes of being unable to speak and staring, but knows what is going on around her. Other times she gets an aura of a seizure, with a deja vu feeling, but then it never happens. Sometimes this is followed by headache and tiredness. Her dose of Keppra is 250 mg in the morning and 500 mg at night. At higher dose she states she had depression, worse ringing in the ears and hair falling out. Her last EEG was in [place] and was video-monitored. She thinks her last MRI was 2 years ago.    PRIOR HPI 02/04/13 (VP): 41 year old right-handed female here for evaluation of seizure disorder. Patient was born full-term, no complications with normal development. Family history seizure only notable in patient's mother's uncle. Patient has had 2 episodes of seizure in her life on 01/04/11 and 03/20/11. First seizure, patient was in the car with her husband and suddenly became unresponsive without warning. She has generalized convulsions. Took her about 20 minutes to regain consciousness. She was slowing confused for several hours. She bit her tongue but did not have incontinence. Second episode occurred in a similar fashion to the first. Following the second seizure patient started on levetiracetam 500 mg twice a day. Within a few months, patient was complaining of depression and being in the ears as well as hair loss and therefore levetiracetam dose was decreased to 500 mg the morning and 500 mg at night. Around this time patient began to have intermittent episodes lasting 1 minute at a time, of expressive and receptive aphasia. She did not lose consciousness. She is able to stand up sit down involuntary move her arms and legs. Sometimes  she is able to get one or 2 words out. These events occur once per week, in clusters of up to 5 in a day.  Patient had video EEG monitoring in [place], which she thinks showed left frontal epileptiform discharges. Patient was pregnant in 2013, and stayed on levetiracetam during the pregnancy. She had no seizures during pregnancy. She gave birth to 2 healthy twin girls, who are now 90 months old. Patient reports that she has not had significant seizures in about two years. Recently, she has been having seizures and would like to be assessed and medication changes made as appropriate.    Observations/Objective:  Generalized: Well developed, in no acute distress  Mentation: Alert oriented to time, place, history taking. Follows all commands speech and language fluent   Assessment and Plan:  40 y.o. year old female  has a past medical history of Acid reflux, Kidney stone, and Seizures ([place]). here with    ICD-10-CM   1. 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 We will continue lacosamide 150mg  BID and zonisamide 100mg  QHS. Seizure precautions advised. Healthy lifestyle habits encouraged. She will follow up in 1 year, sooner if needed.   No orders of the defined types were placed in this encounter.   Meds ordered this encounter  Medications   Lacosamide 150 MG TABS    Sig: Take 1 tablet (150 mg total) by mouth 2 (two) times daily.    Dispense:  180 tablet    Refill:  1    Order Specific Question:   Supervising Provider    Answer:   Melvenia Beam [[id number]]   zonisamide (ZONEGRAN) 100 MG capsule    Sig: Take 1 capsule (100 mg total) by mouth at bedtime.    Dispense:  90 capsule    Refill:  3    Order Specific Question:   Supervising Provider    Answer:   Melvenia Beam [id number]     Follow Up Instructions:  I discussed the assessment and  treatment plan with the patient. The patient was provided an opportunity to ask questions and all were answered. The patient agreed with the plan and demonstrated an understanding of the instructions.   The patient was advised to call back or seek an in-person evaluation if the symptoms worsen or if the condition fails to improve as anticipated.  I provided 15 minutes of non-face-to-face time during this encounter. Patient located at their place of residence during [place] visit. Provider is in the office.    Debbora Presto, NP

## 2021-06-29 NOTE — Patient Instructions (Signed)
Below is our plan:  We will continue lacosamide 150mg  twice daily and zonisamide 100mg  at bedtime.   Please make sure you are staying well hydrated. I recommend 50-60 ounces daily. Well balanced diet and regular exercise encouraged. Consistent sleep schedule with 6-8 hours recommended.   Please continue follow up with care team as directed.   Follow up with me in 1 year   You may receive a survey regarding today's visit. I encourage you to leave honest feed back as I do use this information to improve patient care. Thank you for seeing me today!

## 2021-07-01 ENCOUNTER — Other Ambulatory Visit: Payer: Self-pay | Admitting: Diagnostic Neuroimaging

## 2021-07-01 DIAGNOSIS — G40109 Localization-related (focal) (partial) symptomatic epilepsy and epileptic syndromes with simple partial seizures, not intractable, without status epilepticus: Secondary | ICD-10-CM

## 2021-07-04 ENCOUNTER — Telehealth: Payer: No Typology Code available for payment source | Admitting: Family Medicine

## 2021-07-04 ENCOUNTER — Encounter: Payer: Self-pay | Admitting: Family Medicine

## 2021-07-04 DIAGNOSIS — G40109 Localization-related (focal) (partial) symptomatic epilepsy and epileptic syndromes with simple partial seizures, not intractable, without status epilepticus: Secondary | ICD-10-CM

## 2021-07-04 DIAGNOSIS — G40909 Epilepsy, unspecified, not intractable, without status epilepticus: Secondary | ICD-10-CM

## 2021-07-04 MED ORDER — LACOSAMIDE 150 MG PO TABS: 1.0000 | ORAL_TABLET | Freq: Two times a day (BID) | ORAL | 1 refills | Status: DC

## 2021-07-04 MED ORDER — ZONISAMIDE 100 MG PO CAPS: 100.0000 mg | ORAL_CAPSULE | Freq: Every day | ORAL | 3 refills | Status: DC

## 2021-12-05 ENCOUNTER — Emergency Department (HOSPITAL_COMMUNITY)
Admission: EM | Admit: 2021-12-05 | Discharge: 2021-12-06 | Disposition: A | Discharge status: Home / Self Care | Payer: No Typology Code available for payment source | Attending: Emergency Medicine | Admitting: Emergency Medicine

## 2021-12-05 ENCOUNTER — Encounter (HOSPITAL_COMMUNITY): Payer: Self-pay | Admitting: Emergency Medicine

## 2021-12-05 ENCOUNTER — Emergency Department (HOSPITAL_COMMUNITY): Payer: No Typology Code available for payment source

## 2021-12-05 DIAGNOSIS — N83202 Unspecified ovarian cyst, left side: Secondary | ICD-10-CM | POA: Insufficient documentation

## 2021-12-05 DIAGNOSIS — N200 Calculus of kidney: Secondary | ICD-10-CM

## 2021-12-05 DIAGNOSIS — R109 Unspecified abdominal pain: Secondary | ICD-10-CM | POA: Diagnosis present

## 2021-12-05 DIAGNOSIS — N132 Hydronephrosis with renal and ureteral calculous obstruction: Secondary | ICD-10-CM | POA: Diagnosis not present

## 2021-12-05 LAB — CBC WITH DIFFERENTIAL/PLATELET
Abs Immature Granulocytes: 0.03 10*3/uL (ref 0.00–0.07)
Basophils Absolute: 0.1 10*3/uL (ref 0.0–0.1)
Basophils Relative: 1 %
Eosinophils Absolute: 0.1 10*3/uL (ref 0.0–0.5)
Eosinophils Relative: 1 %
HCT: 42.8 % (ref 36.0–46.0)
Hemoglobin: 14.3 g/dL (ref 12.0–15.0)
Immature Granulocytes: 0 %
Lymphocytes Relative: 21 %
Lymphs Abs: 2.4 10*3/uL (ref 0.7–4.0)
MCH: 32.9 pg (ref 26.0–34.0)
MCHC: 33.4 g/dL (ref 30.0–36.0)
MCV: 98.4 fL (ref 80.0–100.0)
Monocytes Absolute: 0.6 10*3/uL (ref 0.1–1.0)
Monocytes Relative: 5 %
Neutro Abs: 8.4 10*3/uL — ABNORMAL HIGH (ref 1.7–7.7)
Neutrophils Relative %: 72 %
Platelets: 282 10*3/uL (ref 150–400)
RBC: 4.35 MIL/uL (ref 3.87–5.11)
RDW: 11.9 % (ref 11.5–15.5)
WBC: 11.6 10*3/uL — ABNORMAL HIGH (ref 4.0–10.5)
nRBC: 0 % (ref 0.0–0.2)

## 2021-12-05 LAB — URINALYSIS, ROUTINE W REFLEX MICROSCOPIC
Bilirubin Urine: NEGATIVE
Glucose, UA: NEGATIVE mg/dL
Ketones, ur: NEGATIVE mg/dL
Leukocytes,Ua: NEGATIVE
Nitrite: NEGATIVE
Protein, ur: NEGATIVE mg/dL
RBC / HPF: >50 RBC/hpf — ABNORMAL HIGH (ref 0–5)
Specific Gravity, Urine: 1.014 (ref 1.005–1.030)
pH: 6 (ref 5.0–8.0)

## 2021-12-05 LAB — PREGNANCY, URINE: Preg Test, Ur: NEGATIVE

## 2021-12-05 MED ORDER — MORPHINE SULFATE (PF) 4 MG/ML IV SOLN
4.0000 mg | Freq: Once | INTRAVENOUS | Status: AC
  Administered 2021-12-05: 4 mg via INTRAVENOUS
  Filled 2021-12-05: qty 1

## 2021-12-05 MED ORDER — SODIUM CHLORIDE 0.9 % IV BOLUS
1000.0000 mL | Freq: Once | INTRAVENOUS | Status: AC
  Administered 2021-12-05: 1000 mL via INTRAVENOUS

## 2021-12-05 MED ORDER — ONDANSETRON HCL 4 MG/2ML IJ SOLN
4.0000 mg | Freq: Once | INTRAMUSCULAR | Status: AC
  Administered 2021-12-05: 4 mg via INTRAVENOUS
  Filled 2021-12-05: qty 2

## 2021-12-05 NOTE — ED Provider Notes (Signed)
[facility] EMERGENCY DEPARTMENT Provider Note   CSN: [id number] Arrival date & time: 12/05/21  2105     History {Add pertinent medical, surgical, social history, OB history to HPI:1} Chief Complaint  Patient presents with   Flank Pain    Beth Vasquez is a 41 y.o. female.  HPI     This is a 41 year old female who presents with left flank pain.  Onset of symptoms yesterday morning.  She states it is in her left flank and radiates into her left groin.  She does have a history of kidney stones but states that this feels somewhat different.  She states that pain is worse whenever she moves her arm or her leg on the left side.  She does not believe she is injured herself and does not remember any heavy lifting.  Has not noted any hematuria.  No fevers.  No dysuria.  She has taken ibuprofen with minimal relief.  Endorses nausea without vomiting.  Home Medications Prior to Admission medications   Medication Sig Start Date End Date Taking? Authorizing Provider  Lacosamide 150 MG TABS Take 1 tablet (150 mg total) by mouth 2 (two) times daily. 07/04/21   Lomax, Amy, NP  Multiple Vitamins-Minerals (WOMENS MULTIVITAMIN PLUS PO) Take 1 tablet by mouth daily.    [provider]  zonisamide (ZONEGRAN) 100 MG capsule Take 1 capsule (100 mg total) by mouth at bedtime. 07/04/21   Lomax, Amy, NP      Allergies    No known allergies    Review of Systems   Review of Systems  Genitourinary:  Positive for flank pain. Negative for dysuria and hematuria.  All other systems reviewed and are negative.  Physical Exam Updated Vital Signs BP 125/90 (BP Location: Right Arm)   Pulse 81   Temp 97.9 F (36.6 C) (Oral)   Resp 16   Ht 1.626 m ('5\' 4"'$ )   Wt 56.7 kg   LMP 12/03/2021   SpO2 99%   BMI 21.46 kg/m  Physical Exam Vitals and nursing note reviewed.  Constitutional:      Appearance: She is well-developed.     Comments: Uncomfortable appearing but nontoxic  HENT:     Head:  Normocephalic and atraumatic.  Eyes:     Pupils: Pupils are equal, round, and reactive to light.  Cardiovascular:     Rate and Rhythm: Normal rate and regular rhythm.     Heart sounds: Normal heart sounds.  Pulmonary:     Effort: Pulmonary effort is normal. No respiratory distress.     Breath sounds: No wheezing.  Abdominal:     General: Bowel sounds are normal.     Palpations: Abdomen is soft.     Tenderness: There is left CVA tenderness.  Musculoskeletal:     Cervical back: Neck supple.  Skin:    General: Skin is warm and dry.  Neurological:     Mental Status: She is alert and oriented to person, place, and time.  Psychiatric:        Mood and Affect: Mood normal.    ED Results / Procedures / Treatments   Labs (all labs ordered are listed, but only abnormal results are displayed) Labs Reviewed  URINALYSIS, ROUTINE W REFLEX MICROSCOPIC - Abnormal; Notable for the following components:      Result Value   APPearance HAZY (*)    Hgb urine dipstick MODERATE (*)    RBC / HPF >50 (*)    Bacteria, UA RARE (*)  All other components within normal limits  PREGNANCY, URINE  CBC WITH DIFFERENTIAL/PLATELET  BASIC METABOLIC PANEL    EKG None  Radiology No results found.  Procedures Procedures  {Document cardiac monitor, telemetry assessment procedure when appropriate:1}  Medications Ordered in ED Medications  sodium chloride 0.9 % bolus 1,000 mL (has no administration in time range)  ondansetron (ZOFRAN) injection 4 mg (has no administration in time range)  morphine (PF) 4 MG/ML injection 4 mg (has no administration in time range)    ED Course/ Medical Decision Making/ A&P                           Medical Decision Making Amount and/or Complexity of Data Reviewed Labs: ordered. Radiology: ordered.  Risk Prescription drug management.   ***  {Document critical care time when appropriate:1} {Document review of labs and clinical decision tools ie heart score,  Chads2Vasc2 etc:1}  {Document your independent review of radiology images, and any outside records:1} {Document your discussion with family members, caretakers, and with consultants:1} {Document social determinants of health affecting pt's care:1} {Document your decision making why or why not admission, treatments were needed:1} Final Clinical Impression(s) / ED Diagnoses Final diagnoses:  None    Rx / DC Orders ED Discharge Orders     None

## 2021-12-05 NOTE — ED Triage Notes (Signed)
Pt c/o left flank pain with radiation to groin. Hx of kidney stones.

## 2021-12-06 LAB — BASIC METABOLIC PANEL
Anion gap: 7 (ref 5–15)
BUN: 18 mg/dL (ref 6–20)
CO2: 23 mmol/L (ref 22–32)
Calcium: 9.7 mg/dL (ref 8.9–10.3)
Chloride: 110 mmol/L (ref 98–111)
Creatinine, Ser: 1.1 mg/dL — ABNORMAL HIGH (ref 0.44–1.00)
GFR, Estimated: >60 mL/min (ref >60)
Glucose, Bld: 108 mg/dL — ABNORMAL HIGH (ref 70–99)
Potassium: 3.5 mmol/L (ref 3.5–5.1)
Sodium: 140 mmol/L (ref 135–145)

## 2021-12-06 MED ORDER — OXYCODONE-ACETAMINOPHEN 5-325 MG PO TABS
1.0000 | ORAL_TABLET | Freq: Once | ORAL | Status: DC
  Filled 2021-12-06: qty 1

## 2021-12-06 MED ORDER — HYDROMORPHONE HCL 1 MG/ML IJ SOLN
1.0000 mg | Freq: Once | INTRAMUSCULAR | Status: AC
  Administered 2021-12-06: 1 mg via INTRAVENOUS
  Filled 2021-12-06: qty 1

## 2021-12-06 MED ORDER — ONDANSETRON 4 MG PO TBDP
4.0000 mg | ORAL_TABLET | Freq: Three times a day (TID) | ORAL | 0 refills | Status: DC | PRN
Start: 2021-12-06 — End: 2022-04-18

## 2021-12-06 MED ORDER — KETOROLAC TROMETHAMINE 30 MG/ML IJ SOLN
30.0000 mg | Freq: Once | INTRAMUSCULAR | Status: AC
Start: 2021-12-06 — End: 2021-12-06
  Administered 2021-12-06: 30 mg via INTRAVENOUS
  Filled 2021-12-06: qty 1

## 2021-12-06 MED ORDER — TAMSULOSIN HCL 0.4 MG PO CAPS: 0.4000 mg | ORAL_CAPSULE | Freq: Every day | ORAL | 0 refills | Status: DC

## 2021-12-06 MED ORDER — OXYCODONE-ACETAMINOPHEN 5-325 MG PO TABS: 1.0000 | ORAL_TABLET | Freq: Four times a day (QID) | ORAL | 0 refills | Status: DC | PRN

## 2021-12-06 NOTE — Discharge Instructions (Signed)
You were seen today for left flank pain.  Your pain is likely related to a kidney stone.  You also have an ovarian cyst on that left side.  Take medications as prescribed.  Follow-up with urology.  Regarding your cyst, you should follow-up with your OB/GYN regarding need for ultrasound imaging.  If you have any new or worsening symptoms, you should be reevaluated.

## 2022-01-15 ENCOUNTER — Other Ambulatory Visit: Payer: Self-pay | Admitting: Neurology

## 2022-01-15 DIAGNOSIS — G40109 Localization-related (focal) (partial) symptomatic epilepsy and epileptic syndromes with simple partial seizures, not intractable, without status epilepticus: Secondary | ICD-10-CM

## 2022-01-18 NOTE — Telephone Encounter (Signed)
Last seen 06/2021, (annual f/u).

## 2022-03-06 ENCOUNTER — Encounter: Payer: Self-pay | Admitting: Family Medicine

## 2022-04-16 ENCOUNTER — Encounter: Payer: Self-pay | Admitting: Family Medicine

## 2022-04-17 NOTE — Patient Instructions (Signed)
Below is our plan:  We will continue zonisamide and lacosamide. Please continue to monitor for kidney stones. Consider seeing urology for discussion of risks of production on zonisamide. I recommend avoiding Wellbutrin (bupropion) for anxiety management but SSRIs like Lexapro are genally safe options to consider for mood management in seizure patients.   Please make sure you are consistent with timing of seizure medication. I recommend annual visit with primary care provider (PCP) for complete physical and routine blood work. I recommend daily intake of vitamin D (400-800iu) and calcium (800-'1000mg'$ ) for bone health. Discuss Dexa screening with PCP.   According to [place] law, you can not drive unless you are seizure / syncope free for at least 6 months and under physician's care.  Please maintain precautions. Do not participate in activities where a loss of awareness could harm you or someone else. No swimming alone, no tub bathing, no hot tubs, no driving, no operating motorized vehicles (cars, ATVs, motocycles, etc), lawnmowers, power tools or firearms. No standing at heights, such as rooftops, ladders or stairs. Avoid hot objects such as stoves, heaters, open fires. Wear a helmet when riding a bicycle, scooter, skateboard, etc. and avoid areas of traffic. Set your water heater to 120 degrees or less.  Please make sure you are staying well hydrated. I recommend 50-60 ounces daily. Well balanced diet and regular exercise encouraged. Consistent sleep schedule with 6-8 hours recommended.   Please continue follow up with care team as directed.   Follow up with me in 1 year   You may receive a survey regarding today's visit. I encourage you to leave honest feed back as I do use this information to improve patient care. Thank you for seeing me today!

## 2022-04-17 NOTE — Progress Notes (Unsigned)
PATIENT: Beth Vasquez DOB: 09-07-1980  REASON FOR VISIT: follow up HISTORY FROM: patient  Virtual Visit via Telephone Note  I connected with Beth Vasquez on 04/18/22 at 10:30 AM EDT by telephone and verified that I am speaking with the correct person using two identifiers.   I discussed the limitations, risks, security and privacy concerns of performing an evaluation and management service by telephone and the availability of in person appointments. I also discussed with the patient that there may be a patient responsible charge related to this service. The patient expressed understanding and agreed to proceed.   History of Present Illness:  04/18/22 ALL: Beth Vasquez returns for seizure follow up. She was last seen 06/2021 and doing well on zonisamide '100mg'$  QHS and lacosamide '150mg'$  BID. Last seizure event was in 2012. She called 02/2022 reporting a kidney stone and requested to wean zonisamide. We did not hear back from her until 04/16/2022 when she had questions regarding anxiety medications that can be used in seizure patients. She has been using magnesium OTC with little benefit. She recently started seeing a counselor. She is interested in considering a mild antidepressant/anti anxiety medication. She reports seizures are very well managed. No events since last being seen. She does not wish to wean zonisamide. She understands increased risks of kidney stone production. She has had two kidney stones in the past 5 years, no previous history. She is not followed by urology. No history of lithotripsy. She has failed lev due to worsening depression and lamotrigine caused abnormal/scary visions.   07/04/2021 ALL: Beth Vasquez is a 41 y.o. female here today for follow up for seizures. Last GTC seizure 2012. Partial seizure (aphasia) stable. She continues to tolerate lacosamide '150mg'$  BID and zonisamide '100mg'$  QHS. She is doing well. No recent events of aphasia. She feels events were associated  with prolonged study times. She is tolerating medications. No concerns.   History (copied from Dr Gladstone Lighter previous note)  UPDATE (05/10/20, VRP): Since last visit, doing well. No seizures. 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 Now back to baseline. Tolerating meds.    UPDATE (05/05/19, VRP): Since last visit, doing well. Symptoms are stable. No seizures or auras. No alleviating or aggravating factors. Tolerating meds.     UPDATE (09/09/18, VRP): Since last visit, doing well. Now on improved exercise program and feels much better. Also improving her nutrition. Memory lapses are stable. Aphasia attacks have stopped. Tolerating meds. No major seizures.     UPDATE (03/04/18, VRP): Since last visit, doing patient continues to have one spell per month of transient aphasia lasting less than 1 minute. Symptoms are moderate.  Patient does note persistent mild hair loss.  She also notes ongoing slightly worsening memory loss in conversations with family and friends.  Patient notes that carbohydrate intake and rest affect the frequency of these spells.  No other alleviating or aggravating factors. Tolerating Vimpat and zonisamide.   UPDATE 02/15/17: Since last visit patient doing well. No major seizures. Patient has rare aphasia spells if she increases her carbohydrate intake or is not get proper rest. Patient has noticed more hair loss in the last 1 year.   UPDATE 02/15/16: Since last visit, doing well. Seizures are less frequent (more than 1 month per event). Hydration, low carbs, rest seem to help avoid seizures. Tolerating meds. No kidney stone issues. Has gained 10lbs which was desirable for patient.  UPDATE 07/07/15: Since last visit, still with small clusters of aphasia events (3-4 events in a day; once per month). Overall stable. Had kidney stone (severe) August 2016, went to ER. No recurrent stones.    UPDATE 12/28/14:  Since last visit, doing much better. Went to [facility] for second opinion, continued vimpat, and then LEV was switched to [facility]. A few months ago, tried cutting out sugars/carb to lose some weight, and noticed that her seizures almost stopped. Now occ if she has sugars for 2-3 days, then she may have a few breakthrough partial seizures (aphasia x 20 sec).    UPDATE 11/03/13: Since last visit, continued on LTG '100mg'$  BID, but started having anxiety attacks and "disturbing" images in her mind (knife etc). She was advised to taper off LTG by my colleagues in my absence. Since that time, she has come down to LTG '25mg'$  BID, and the anxiety / images have subsided. Now her aphasia / partial seizures are back up to 2 per day. No grand mal seizures since 2012.   UPDATE 10/14/13: Since last visit, tried vimpat but was too expensive. Now on LTG '100mg'$  BID, and doing better. At LTG '75mg'$  BID, was event free x 2 weeks. Now on LTG '100mg'$  BID x 2 weeks, and only had 1 event yesterday. Events are consistent with 30 seconds of exp and receptive aphasia. No confusion, convulsions or staring. Able to walk, move and function. She is awake/aware when these are occuring.    UPDATE 07/22/13 (LL): Patient returns for earlier revisit due to increased partial seizures. She has almost daily episodes of being unable to speak and staring, but knows what is going on around her. Other times she gets an aura of a seizure, with a deja vu feeling, but then it never happens. Sometimes this is followed by headache and tiredness. Her dose of Keppra is 250 mg in the morning and 500 mg at night. At higher dose she states she had depression, worse ringing in the ears and hair falling out. Her last EEG was in [place] and was video-monitored. She thinks her last MRI was 2 years ago.    PRIOR HPI 02/04/13 (VP): 41 year old right-handed female here for evaluation of seizure disorder. Patient was born full-term, no complications with normal development. Family history  seizure only notable in patient's mother's uncle. Patient has had 2 episodes of seizure in her life on 01/04/11 and 03/20/11. First seizure, patient was in the car with her husband and suddenly became unresponsive without warning. She has generalized convulsions. Took her about 20 minutes to regain consciousness. She was slowing confused for several hours. She bit her tongue but did not have incontinence. Second episode occurred in a similar fashion to the first. Following the second seizure patient started on levetiracetam 500 mg twice a day. Within a few months, patient was complaining of depression and being in the ears as well as hair loss and therefore levetiracetam dose was decreased to 500 mg the morning and 500 mg at night. Around this time patient began to have intermittent episodes lasting 1 minute at a time, of expressive and receptive aphasia. She did not lose consciousness. She is able to stand up sit down involuntary move her arms and legs. Sometimes she is able to get one or 2 words out. These events occur once per week, in clusters of up to 5 in a day.  Patient had video EEG monitoring in [place], which she thinks showed left frontal epileptiform discharges. Patient was pregnant  in 2013, and stayed on levetiracetam during the pregnancy. She had no seizures during pregnancy. She gave birth to 2 healthy twin girls, who are now 58 months old. Patient reports that she has not had significant seizures in about two years. Recently, she has been having seizures and would like to be assessed and medication changes made as appropriate.    Observations/Objective:  Generalized: Well developed, in no acute distress  Mentation: Alert oriented to time, place, history taking. Follows all commands speech and language fluent   Assessment and Plan:  41 y.o. year old female  has a past medical history of Acid reflux, Kidney stone, and Seizures ([place]). 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 Anxiety  F41.9       Nettie is doing very well from a seizure standpoint. We have discussed, in detail, concerns of increased risk for kidney stone production on zonisamide. She has failed levetiracetam and lamotrigine in the past and does not wish to change AED at this time. She was encouraged to discuss with PCP and consider visit with urology to understand complete risk as this is second kidney stone in 5 years. We will continue lacosamide '150mg'$  BID and zonisamide '100mg'$  QHS. Seizure precautions advised. We have discussed use of SSRI in seizure patients. Although any medication that effects the brain could lower seizure threshold, I feel that risk is low with SSRI use. I would encourage she avoid Wellbutrin (bupropion) or other stimulant type medicaitons. She was encouraged to continue counseling. Healthy lifestyle habits encouraged. She will follow up in 1 year, sooner if needed.   No orders of the defined types were placed in this encounter.   No orders of the defined types were placed in this encounter.    Follow Up Instructions:  I discussed the assessment and treatment plan with the patient. The patient was provided an opportunity to ask questions and all were answered. The patient agreed with the plan and demonstrated an understanding of the instructions.   The patient was advised to call back or seek an in-person evaluation if the symptoms worsen or if the condition fails to improve as anticipated.  I provided 15 minutes of non-face-to-face time during this encounter. Patient located at their place of residence during [place] visit. Provider is in the office.    Debbora Presto, NP

## 2022-04-18 ENCOUNTER — Encounter: Payer: Self-pay | Admitting: Family Medicine

## 2022-04-18 ENCOUNTER — Telehealth (INDEPENDENT_AMBULATORY_CARE_PROVIDER_SITE_OTHER): Payer: No Typology Code available for payment source | Admitting: Family Medicine

## 2022-04-18 DIAGNOSIS — G40109 Localization-related (focal) (partial) symptomatic epilepsy and epileptic syndromes with simple partial seizures, not intractable, without status epilepticus: Secondary | ICD-10-CM | POA: Diagnosis not present

## 2022-04-18 DIAGNOSIS — F419 Anxiety disorder, unspecified: Secondary | ICD-10-CM

## 2022-04-18 MED ORDER — ZONISAMIDE 100 MG PO CAPS: 100.0000 mg | ORAL_CAPSULE | Freq: Every day | ORAL | 3 refills | Status: DC

## 2022-06-26 ENCOUNTER — Encounter (HOSPITAL_BASED_OUTPATIENT_CLINIC_OR_DEPARTMENT_OTHER): Payer: Self-pay | Admitting: Obstetrics and Gynecology

## 2022-06-26 NOTE — Progress Notes (Signed)
Spoke w/ via phone for pre-op interview--- pt Lab needs dos----   urine preg (per anes)            Lab results------ no COVID test -----patient states asymptomatic no test needed Arrive at ------- 0530 on 06-28-2022 NPO after MN NO Solid Food.  Clear liquids from MN until--- 0430 Med rec completed Medications to take morning of surgery ----- lacosamide, prozac, flonase spray Diabetic medication ----- n/a Patient instructed no nail polish to be worn day of surgery Patient instructed to bring photo id and insurance card day of surgery Patient aware to have Driver (ride ) / caregiver for 24 hours after surgery -- husband, Beth Vasquez Patient Special Instructions ----- reviewed RCC and visitor guidelines Pre-Op special Istructions ----- sent inbox message to dr bovard in epic , requested orders Patient verbalized understanding of instructions that were given at this phone interview. Patient denies shortness of breath, chest pain, fever, cough at this phone interview.

## 2022-06-27 ENCOUNTER — Encounter (HOSPITAL_BASED_OUTPATIENT_CLINIC_OR_DEPARTMENT_OTHER): Payer: Self-pay | Admitting: Obstetrics and Gynecology

## 2022-06-27 NOTE — Anesthesia Preprocedure Evaluation (Signed)
Anesthesia Evaluation  Patient identified by MRN, date of birth, ID band Patient awake    Reviewed: Allergy & Precautions, NPO status , Patient's Chart, lab work & pertinent test results  History of Anesthesia Complications (+) PONV and history of anesthetic complications  Airway Mallampati: II       Dental no notable dental hx. (+) Teeth Intact   Pulmonary    Pulmonary exam normal breath sounds clear to auscultation       Cardiovascular negative cardio ROS Normal cardiovascular exam Rhythm:Regular Rate:Normal     Neuro/Psych Seizures -, Well Controlled,  PSYCHIATRIC DISORDERS Anxiety Depression    PMDDLast Sz 2018    GI/Hepatic Neg liver ROS,GERD  ,,  Endo/Other  negative endocrine ROS    Renal/GU negative Renal ROS  negative genitourinary   Musculoskeletal negative musculoskeletal ROS (+)    Abdominal   Peds  Hematology negative hematology ROS (+)   Anesthesia Other Findings   Reproductive/Obstetrics Dermoid cyst ovary Desires sterilization                              Anesthesia Physical Anesthesia Plan  ASA: 2  Anesthesia Plan: General   Post-op Pain Management: Minimal or no pain anticipated, Tylenol PO (pre-op)* and Dilaudid IV   Induction:   PONV Risk Score and Plan: 4 or greater and Treatment may vary due to age or medical condition, Scopolamine patch - Pre-op, Ondansetron and Dexamethasone  Airway Management Planned: Oral ETT  Additional Equipment: None  Intra-op Plan:   Post-operative Plan: Extubation in OR  Informed Consent: I have reviewed the patients History and Physical, chart, labs and discussed the procedure including the risks, benefits and alternatives for the proposed anesthesia with the patient or authorized representative who has indicated his/her understanding and acceptance.     Dental advisory given  Plan Discussed with: CRNA and  Anesthesiologist  Anesthesia Plan Comments:          Anesthesia Quick Evaluation

## 2022-06-27 NOTE — H&P (Signed)
Beth Vasquez is an 41 y.o. femaleG1P1002 with mature cystic teratoma on [place], some pelvic pressure.  Medical history complicated by seizure disorder.  D/W pt r/b/a of surgery, also process and expectations - Pt declines laparoscopic or robot assisted procedure desiring minilaparotomy for removal of dermoid, also B salpingectomy.    Pertinent Gynecological History: G8P1002 - LTCS 37week twins female x 2 MMG 04/05/22 - WNL Nl abn pap, last HPV neg  [place] nl uterus 4.4cm x 3.9 cm dermoid on L ovary.    Menstrual History:  Patient's last menstrual period was 06/06/2022 (exact date).    Past Medical History:  Diagnosis Date   Dermoid cyst of ovary    GAD (generalized anxiety disorder)    GERD (gastroesophageal reflux disease)    History of kidney stones    Localization-related epilepsy [facility]) 2012   neurologist--- dr penumalli/ amy lomax np--   hx 2 seizures 06/ 2012 and 09/ 2012,  pt stated last seizure approx 2018   PMDD (premenstrual dysphoric disorder)    PONV (postoperative nausea and vomiting)     Past Surgical History:  Procedure Laterality Date   ABDOMINOPLASTY  2022   AUGMENTATION MAMMAPLASTY Bilateral 2006   saline   CESAREAN SECTION  2013   HYSTEROSCOPY WITH D & C  08/2011    Family History  Problem Relation Age of Onset   Breast cancer Mother    Seizures Maternal Uncle    Hypertension Brother   Cervical cancer - aunt  Social History:  reports that she has never smoked. She has never used smokeless tobacco. She reports that she does not currently use alcohol. She reports current drug use. Drug: Marijuana.married, SAHM  Allergies: No Known Allergies  Meds: Xanax, prozac, lacosamide, zonisamide  Review of Systems  Constitutional: Negative.   HENT: Negative.    Respiratory: Negative.    Gastrointestinal: Negative.   Genitourinary:  Positive for pelvic pain.  Musculoskeletal: Negative.   Skin: Negative.   Neurological: Negative.   Psychiatric/Behavioral:  Negative.      Height '5\' 4"'$  (1.626 m), weight 56.7 kg, last menstrual period 06/06/2022. Physical Exam Constitutional:      Appearance: Normal appearance. She is normal weight.  HENT:     Head: Normocephalic and atraumatic.  Cardiovascular:     Rate and Rhythm: Normal rate and regular rhythm.  Pulmonary:     Effort: Pulmonary effort is normal.     Breath sounds: Normal breath sounds.  Abdominal:     General: Abdomen is flat. Bowel sounds are normal.     Palpations: Abdomen is soft.  Genitourinary:    General: Normal vulva.     Rectum: Normal.  Musculoskeletal:        General: Normal range of motion.     Cervical back: Normal range of motion and neck supple.  Skin:    General: Skin is warm and dry.  Neurological:     General: No focal deficit present.     Mental Status: She is alert and oriented to person, place, and time.  Psychiatric:        Mood and Affect: Mood normal.        Behavior: Behavior normal.    [place] - nl uterus, L sided 4.4 x 3.9 cm dermoid cyst.    Assessment/Plan: 42VZ [id number] with dermoid cyst for minilap and removal, also B salpingectomy.   D/w pt r/b/a of surgery, process and expectations Will plan for 23 hr observation   Meridian Scherger Bovard-Stuckert 06/27/2022, 5:59 PM

## 2022-06-28 ENCOUNTER — Ambulatory Visit (HOSPITAL_BASED_OUTPATIENT_CLINIC_OR_DEPARTMENT_OTHER): Payer: No Typology Code available for payment source | Admitting: Anesthesiology

## 2022-06-28 ENCOUNTER — Other Ambulatory Visit: Payer: Self-pay

## 2022-06-28 ENCOUNTER — Encounter (HOSPITAL_BASED_OUTPATIENT_CLINIC_OR_DEPARTMENT_OTHER)
Admission: RE | Disposition: A | Discharge status: Home / Self Care | Payer: Self-pay | Source: Home / Self Care | Attending: Obstetrics and Gynecology

## 2022-06-28 ENCOUNTER — Ambulatory Visit (HOSPITAL_BASED_OUTPATIENT_CLINIC_OR_DEPARTMENT_OTHER): Admit: 2022-06-28 | Payer: No Typology Code available for payment source | Admitting: Obstetrics and Gynecology

## 2022-06-28 ENCOUNTER — Encounter (HOSPITAL_BASED_OUTPATIENT_CLINIC_OR_DEPARTMENT_OTHER): Payer: Self-pay

## 2022-06-28 ENCOUNTER — Observation Stay (HOSPITAL_BASED_OUTPATIENT_CLINIC_OR_DEPARTMENT_OTHER)
Admission: RE | Admit: 2022-06-28 | Discharge: 2022-06-29 | Disposition: A | Discharge status: Home / Self Care | Payer: No Typology Code available for payment source | Attending: Obstetrics and Gynecology | Admitting: Obstetrics and Gynecology

## 2022-06-28 ENCOUNTER — Encounter (HOSPITAL_BASED_OUTPATIENT_CLINIC_OR_DEPARTMENT_OTHER): Payer: Self-pay | Admitting: Obstetrics and Gynecology

## 2022-06-28 DIAGNOSIS — D279 Benign neoplasm of unspecified ovary: Secondary | ICD-10-CM

## 2022-06-28 DIAGNOSIS — D270 Benign neoplasm of right ovary: Secondary | ICD-10-CM | POA: Insufficient documentation

## 2022-06-28 DIAGNOSIS — Z640 Problems related to unwanted pregnancy: Secondary | ICD-10-CM | POA: Diagnosis not present

## 2022-06-28 DIAGNOSIS — D271 Benign neoplasm of left ovary: Secondary | ICD-10-CM | POA: Diagnosis present

## 2022-06-28 DIAGNOSIS — Z302 Encounter for sterilization: Secondary | ICD-10-CM

## 2022-06-28 DIAGNOSIS — Z01818 Encounter for other preprocedural examination: Secondary | ICD-10-CM

## 2022-06-28 DIAGNOSIS — Z9851 Tubal ligation status: Secondary | ICD-10-CM

## 2022-06-28 DIAGNOSIS — Z90721 Acquired absence of ovaries, unilateral: Secondary | ICD-10-CM

## 2022-06-28 HISTORY — PX: LAPAROTOMY: SHX154

## 2022-06-28 HISTORY — DX: Personal history of urinary calculi: Z87.442

## 2022-06-28 HISTORY — DX: Premenstrual dysphoric disorder: F32.81

## 2022-06-28 HISTORY — DX: Gastro-esophageal reflux disease without esophagitis: K21.9

## 2022-06-28 HISTORY — DX: Benign neoplasm of unspecified ovary: D27.9

## 2022-06-28 HISTORY — DX: Nausea with vomiting, unspecified: R11.2

## 2022-06-28 HISTORY — DX: Generalized anxiety disorder: F41.1

## 2022-06-28 HISTORY — DX: Other specified postprocedural states: Z98.890

## 2022-06-28 LAB — CBC
HCT: 47.4 % — ABNORMAL HIGH (ref 36.0–46.0)
Hemoglobin: 15.5 g/dL — ABNORMAL HIGH (ref 12.0–15.0)
MCH: 33.2 pg (ref 26.0–34.0)
MCHC: 32.7 g/dL (ref 30.0–36.0)
MCV: 101.5 fL — ABNORMAL HIGH (ref 80.0–100.0)
Platelets: 246 10*3/uL (ref 150–400)
RBC: 4.67 MIL/uL (ref 3.87–5.11)
RDW: 12.1 % (ref 11.5–15.5)
WBC: 9.7 10*3/uL (ref 4.0–10.5)
nRBC: 0 % (ref 0.0–0.2)

## 2022-06-28 LAB — POCT PREGNANCY, URINE: Preg Test, Ur: NEGATIVE

## 2022-06-28 SURGERY — LAPAROTOMY
Anesthesia: General | Site: Abdomen

## 2022-06-28 SURGERY — LAPAROTOMY
Anesthesia: General

## 2022-06-28 MED ORDER — OXYCODONE HCL 5 MG/5ML PO SOLN: 5.0000 mg | Freq: Once | ORAL | Status: AC | PRN

## 2022-06-28 MED ORDER — SCOPOLAMINE 1 MG/3DAYS TD PT72
MEDICATED_PATCH | TRANSDERMAL | Status: AC
  Filled 2022-06-28: qty 1

## 2022-06-28 MED ORDER — OXYCODONE-ACETAMINOPHEN 5-325 MG PO TABS
ORAL_TABLET | ORAL | Status: AC
  Filled 2022-06-28: qty 1

## 2022-06-28 MED ORDER — ONDANSETRON HCL 4 MG/2ML IJ SOLN: 4.0000 mg | Freq: Four times a day (QID) | INTRAMUSCULAR | Status: DC | PRN

## 2022-06-28 MED ORDER — FENTANYL CITRATE (PF) 100 MCG/2ML IJ SOLN
INTRAMUSCULAR | Status: AC
  Filled 2022-06-28: qty 2

## 2022-06-28 MED ORDER — SODIUM CHLORIDE (PF) 0.9 % IJ SOLN
INTRAMUSCULAR | Status: AC
  Filled 2022-06-28: qty 10

## 2022-06-28 MED ORDER — AMISULPRIDE (ANTIEMETIC) 5 MG/2ML IV SOLN: 10.0000 mg | Freq: Once | INTRAVENOUS | Status: DC | PRN

## 2022-06-28 MED ORDER — ONDANSETRON HCL 4 MG/2ML IJ SOLN
INTRAMUSCULAR | Status: AC
  Filled 2022-06-28: qty 2

## 2022-06-28 MED ORDER — SIMETHICONE 80 MG PO CHEW
CHEWABLE_TABLET | ORAL | Status: AC
  Filled 2022-06-28: qty 1

## 2022-06-28 MED ORDER — IBUPROFEN 800 MG PO TABS
ORAL_TABLET | ORAL | Status: AC
  Filled 2022-06-28: qty 1

## 2022-06-28 MED ORDER — SIMETHICONE 80 MG PO CHEW
80.0000 mg | CHEWABLE_TABLET | Freq: Four times a day (QID) | ORAL | Status: DC | PRN
  Administered 2022-06-28: 80 mg via ORAL

## 2022-06-28 MED ORDER — HYDROMORPHONE HCL 1 MG/ML IJ SOLN
INTRAMUSCULAR | Status: DC | PRN
  Administered 2022-06-28 (×3): .5 mg via INTRAVENOUS
  Administered 2022-06-28 (×2): .25 mg via INTRAVENOUS

## 2022-06-28 MED ORDER — MIDAZOLAM HCL 2 MG/2ML IJ SOLN
2.0000 mg | Freq: Once | INTRAMUSCULAR | Status: AC
  Administered 2022-06-28: 2 mg via INTRAVENOUS

## 2022-06-28 MED ORDER — DEXAMETHASONE SODIUM PHOSPHATE 10 MG/ML IJ SOLN
INTRAMUSCULAR | Status: DC | PRN
  Administered 2022-06-28: 10 mg via INTRAVENOUS

## 2022-06-28 MED ORDER — LACTATED RINGERS IV SOLN: INTRAVENOUS | Status: DC

## 2022-06-28 MED ORDER — LIDOCAINE HCL (PF) 2 % IJ SOLN
INTRAMUSCULAR | Status: AC
  Filled 2022-06-28: qty 5

## 2022-06-28 MED ORDER — BUPIVACAINE LIPOSOME 1.3 % IJ SUSP
INTRAMUSCULAR | Status: DC | PRN
  Administered 2022-06-28 (×2): 5 mL via PERINEURAL

## 2022-06-28 MED ORDER — GABAPENTIN 300 MG PO CAPS
ORAL_CAPSULE | ORAL | Status: AC
  Filled 2022-06-28: qty 1

## 2022-06-28 MED ORDER — ZONISAMIDE 100 MG PO CAPS
100.0000 mg | ORAL_CAPSULE | Freq: Every day | ORAL | Status: DC
  Administered 2022-06-28: 100 mg via ORAL
  Filled 2022-06-28 (×2): qty 1

## 2022-06-28 MED ORDER — HYDROMORPHONE HCL 2 MG/ML IJ SOLN
INTRAMUSCULAR | Status: AC
  Filled 2022-06-28: qty 1

## 2022-06-28 MED ORDER — KETOROLAC TROMETHAMINE 30 MG/ML IJ SOLN
INTRAMUSCULAR | Status: AC
  Filled 2022-06-28: qty 1

## 2022-06-28 MED ORDER — OXYCODONE HCL 5 MG PO TABS
5.0000 mg | ORAL_TABLET | Freq: Once | ORAL | Status: AC | PRN
  Administered 2022-06-28: 5 mg via ORAL

## 2022-06-28 MED ORDER — ROCURONIUM BROMIDE 10 MG/ML (PF) SYRINGE
PREFILLED_SYRINGE | INTRAVENOUS | Status: AC
  Filled 2022-06-28: qty 10

## 2022-06-28 MED ORDER — PROPOFOL 10 MG/ML IV BOLUS
INTRAVENOUS | Status: DC | PRN
  Administered 2022-06-28: 20 mg via INTRAVENOUS
  Administered 2022-06-28: 180 mg via INTRAVENOUS

## 2022-06-28 MED ORDER — POVIDONE-IODINE 10 % EX SWAB: 2.0000 | Freq: Once | CUTANEOUS | Status: DC

## 2022-06-28 MED ORDER — ROCURONIUM BROMIDE 10 MG/ML (PF) SYRINGE
PREFILLED_SYRINGE | INTRAVENOUS | Status: DC | PRN
  Administered 2022-06-28: 10 mg via INTRAVENOUS
  Administered 2022-06-28: 40 mg via INTRAVENOUS

## 2022-06-28 MED ORDER — SUGAMMADEX SODIUM 200 MG/2ML IV SOLN
INTRAVENOUS | Status: DC | PRN
  Administered 2022-06-28: 50 mg via INTRAVENOUS
  Administered 2022-06-28: 200 mg via INTRAVENOUS

## 2022-06-28 MED ORDER — ACETAMINOPHEN 500 MG PO TABS
1000.0000 mg | ORAL_TABLET | ORAL | Status: AC
  Administered 2022-06-28: 1000 mg via ORAL

## 2022-06-28 MED ORDER — LIDOCAINE 2% (20 MG/ML) 5 ML SYRINGE
INTRAMUSCULAR | Status: DC | PRN
  Administered 2022-06-28: 50 mg via INTRAVENOUS

## 2022-06-28 MED ORDER — OXYCODONE-ACETAMINOPHEN 5-325 MG PO TABS
1.0000 | ORAL_TABLET | ORAL | Status: DC | PRN
  Administered 2022-06-28 (×3): 1 via ORAL
  Administered 2022-06-28: 2 via ORAL
  Administered 2022-06-29 (×2): 1 via ORAL

## 2022-06-28 MED ORDER — PROPOFOL 10 MG/ML IV BOLUS
INTRAVENOUS | Status: AC
  Filled 2022-06-28: qty 20

## 2022-06-28 MED ORDER — OXYCODONE-ACETAMINOPHEN 5-325 MG PO TABS
ORAL_TABLET | ORAL | Status: AC
  Filled 2022-06-28: qty 2

## 2022-06-28 MED ORDER — FLUTICASONE PROPIONATE 50 MCG/ACT NA SUSP
1.0000 | Freq: Every day | NASAL | Status: DC
  Filled 2022-06-28: qty 16

## 2022-06-28 MED ORDER — EPHEDRINE 5 MG/ML INJ
INTRAVENOUS | Status: AC
  Filled 2022-06-28: qty 5

## 2022-06-28 MED ORDER — KETOROLAC TROMETHAMINE 30 MG/ML IJ SOLN
INTRAMUSCULAR | Status: DC | PRN
  Administered 2022-06-28: 30 mg via INTRAVENOUS

## 2022-06-28 MED ORDER — HYDROMORPHONE HCL 1 MG/ML IJ SOLN
INTRAMUSCULAR | Status: AC
  Filled 2022-06-28: qty 1

## 2022-06-28 MED ORDER — ARTIFICIAL TEARS OPHTHALMIC OINT
TOPICAL_OINTMENT | OPHTHALMIC | Status: AC
  Filled 2022-06-28: qty 3.5

## 2022-06-28 MED ORDER — BUPIVACAINE HCL (PF) 0.25 % IJ SOLN
INTRAMUSCULAR | Status: DC | PRN
  Administered 2022-06-28 (×2): 25 mL via PERINEURAL

## 2022-06-28 MED ORDER — SCOPOLAMINE 1 MG/3DAYS TD PT72
1.0000 | MEDICATED_PATCH | TRANSDERMAL | Status: DC
  Administered 2022-06-28: 1.5 mg via TRANSDERMAL

## 2022-06-28 MED ORDER — HYDROMORPHONE HCL 1 MG/ML IJ SOLN
0.2500 mg | INTRAMUSCULAR | Status: DC | PRN
  Administered 2022-06-28 (×2): 0.5 mg via INTRAVENOUS

## 2022-06-28 MED ORDER — ONDANSETRON HCL 4 MG/2ML IJ SOLN
INTRAMUSCULAR | Status: DC | PRN
  Administered 2022-06-28: 4 mg via INTRAVENOUS

## 2022-06-28 MED ORDER — ONDANSETRON HCL 4 MG PO TABS: 4.0000 mg | ORAL_TABLET | Freq: Four times a day (QID) | ORAL | Status: DC | PRN

## 2022-06-28 MED ORDER — STERILE WATER FOR IRRIGATION IR SOLN
Status: DC | PRN
  Administered 2022-06-28: 1000 mL

## 2022-06-28 MED ORDER — GABAPENTIN 300 MG PO CAPS
300.0000 mg | ORAL_CAPSULE | ORAL | Status: AC
  Administered 2022-06-28: 300 mg via ORAL

## 2022-06-28 MED ORDER — HYDROMORPHONE HCL 1 MG/ML IJ SOLN: 0.2000 mg | INTRAMUSCULAR | Status: DC | PRN

## 2022-06-28 MED ORDER — FENTANYL CITRATE (PF) 100 MCG/2ML IJ SOLN
INTRAMUSCULAR | Status: DC | PRN
  Administered 2022-06-28: 100 ug via INTRAVENOUS

## 2022-06-28 MED ORDER — MIDAZOLAM HCL 2 MG/2ML IJ SOLN
INTRAMUSCULAR | Status: AC
  Filled 2022-06-28: qty 2

## 2022-06-28 MED ORDER — EPHEDRINE SULFATE-NACL 50-0.9 MG/10ML-% IV SOSY
PREFILLED_SYRINGE | INTRAVENOUS | Status: DC | PRN
  Administered 2022-06-28: 10 mg via INTRAVENOUS

## 2022-06-28 MED ORDER — HYDROMORPHONE 1 MG/ML IV SOLN
INTRAVENOUS | Status: DC
  Filled 2022-06-28: qty 30

## 2022-06-28 MED ORDER — IBUPROFEN 800 MG PO TABS
800.0000 mg | ORAL_TABLET | Freq: Three times a day (TID) | ORAL | Status: DC | PRN
  Administered 2022-06-28 – 2022-06-29 (×2): 800 mg via ORAL

## 2022-06-28 MED ORDER — FENTANYL CITRATE (PF) 100 MCG/2ML IJ SOLN
100.0000 ug | Freq: Once | INTRAMUSCULAR | Status: AC
  Administered 2022-06-28: 50 ug via INTRAVENOUS

## 2022-06-28 MED ORDER — CEFAZOLIN SODIUM-DEXTROSE 2-4 GM/100ML-% IV SOLN
2.0000 g | INTRAVENOUS | Status: AC
  Administered 2022-06-28: 2 g via INTRAVENOUS

## 2022-06-28 MED ORDER — DEXAMETHASONE SODIUM PHOSPHATE 10 MG/ML IJ SOLN
INTRAMUSCULAR | Status: AC
  Filled 2022-06-28: qty 1

## 2022-06-28 MED ORDER — DIPHENHYDRAMINE HCL 12.5 MG/5ML PO ELIX: 12.5000 mg | ORAL_SOLUTION | Freq: Four times a day (QID) | ORAL | Status: DC | PRN

## 2022-06-28 MED ORDER — DIPHENHYDRAMINE HCL 50 MG/ML IJ SOLN: 12.5000 mg | Freq: Four times a day (QID) | INTRAMUSCULAR | Status: DC | PRN

## 2022-06-28 MED ORDER — ACETAMINOPHEN 500 MG PO TABS
ORAL_TABLET | ORAL | Status: AC
  Filled 2022-06-28: qty 2

## 2022-06-28 MED ORDER — BUPIVACAINE HCL (PF) 0.25 % IJ SOLN
INTRAMUSCULAR | Status: DC | PRN
  Administered 2022-06-28: 20 mL

## 2022-06-28 MED ORDER — LACOSAMIDE 50 MG PO TABS
150.0000 mg | ORAL_TABLET | Freq: Two times a day (BID) | ORAL | Status: DC
  Administered 2022-06-28 (×2): 150 mg via ORAL
  Filled 2022-06-28 (×2): qty 3

## 2022-06-28 MED ORDER — GUAIFENESIN 100 MG/5ML PO LIQD
15.0000 mL | ORAL | Status: DC | PRN
  Filled 2022-06-28: qty 15

## 2022-06-28 MED ORDER — FLUOXETINE HCL 10 MG PO CAPS
10.0000 mg | ORAL_CAPSULE | Freq: Every day | ORAL | Status: DC
  Administered 2022-06-28: 10 mg via ORAL
  Filled 2022-06-28: qty 1

## 2022-06-28 MED ORDER — ALUM & MAG HYDROXIDE-SIMETH 200-200-20 MG/5ML PO SUSP: 30.0000 mL | ORAL | Status: DC | PRN

## 2022-06-28 MED ORDER — OXYCODONE HCL 5 MG PO TABS
ORAL_TABLET | ORAL | Status: AC
  Filled 2022-06-28: qty 1

## 2022-06-28 MED ORDER — ONDANSETRON HCL 4 MG/2ML IJ SOLN: 4.0000 mg | Freq: Once | INTRAMUSCULAR | Status: DC | PRN

## 2022-06-28 MED ORDER — SODIUM CHLORIDE 0.9% FLUSH: 9.0000 mL | INTRAVENOUS | Status: DC | PRN

## 2022-06-28 MED ORDER — MENTHOL 3 MG MT LOZG: 1.0000 | LOZENGE | OROMUCOSAL | Status: DC | PRN

## 2022-06-28 MED ORDER — CEFAZOLIN SODIUM-DEXTROSE 2-4 GM/100ML-% IV SOLN
INTRAVENOUS | Status: AC
  Filled 2022-06-28: qty 100

## 2022-06-28 MED ORDER — NALOXONE HCL 0.4 MG/ML IJ SOLN: 0.4000 mg | INTRAMUSCULAR | Status: DC | PRN

## 2022-06-28 SURGICAL SUPPLY — 54 items
ADH SKN CLS APL DERMABOND .7 (GAUZE/BANDAGES/DRESSINGS) ×2
APL SKNCLS STERI-STRIP NONHPOA (GAUZE/BANDAGES/DRESSINGS) ×2
BENZOIN TINCTURE PRP APPL 2/3 (GAUZE/BANDAGES/DRESSINGS) ×1 IMPLANT
BLADE SURG 10 STRL SS (BLADE) ×4 IMPLANT
CABLE HIGH FREQUENCY MONO STRZ (ELECTRODE) IMPLANT
CELLS DAT CNTRL 66122 CELL SVR (MISCELLANEOUS) ×2 IMPLANT
COVER MAYO STAND STRL (DRAPES) ×2 IMPLANT
DERMABOND ADVANCED .7 DNX12 (GAUZE/BANDAGES/DRESSINGS) ×2 IMPLANT
DRAPE WARM FLUID 44X44 (DRAPES) ×1 IMPLANT
DRSG OPSITE POSTOP 3X4 (GAUZE/BANDAGES/DRESSINGS) IMPLANT
DRSG OPSITE POSTOP 4X10 (GAUZE/BANDAGES/DRESSINGS) ×1 IMPLANT
DURAPREP 26ML APPLICATOR (WOUND CARE) ×3 IMPLANT
GAUZE VASELINE 3X9 (GAUZE/BANDAGES/DRESSINGS) IMPLANT
GLOVE BIO SURGEON STRL SZ 6.5 (GLOVE) ×4 IMPLANT
GLOVE BIOGEL PI IND STRL 7.0 (GLOVE) ×2 IMPLANT
GOWN STRL REUS W/TWL LRG LVL3 (GOWN DISPOSABLE) ×5 IMPLANT
HIBICLENS CHG 4% 4OZ BTL (MISCELLANEOUS) ×1 IMPLANT
KIT TURNOVER CYSTO (KITS) ×2 IMPLANT
NDL HYPO 21X1 ECLIPSE (NEEDLE) IMPLANT
NDL HYPO 25X1 1.5 SAFETY (NEEDLE) IMPLANT
NDL INSUFFLATION 14GA 120MM (NEEDLE) ×1 IMPLANT
NEEDLE HYPO 21X1 ECLIPSE (NEEDLE) ×2 IMPLANT
NEEDLE HYPO 25X1 1.5 SAFETY (NEEDLE) ×2 IMPLANT
NEEDLE INSUFFLATION 14GA 120MM (NEEDLE) IMPLANT
PACK ABDOMINAL GYN (CUSTOM PROCEDURE TRAY) ×2 IMPLANT
PACK LAPAROSCOPY BASIN (CUSTOM PROCEDURE TRAY) ×1 IMPLANT
PAD OB MATERNITY 4.3X12.25 (PERSONAL CARE ITEMS) ×2 IMPLANT
PENCIL SMOKE EVACUATOR (MISCELLANEOUS) ×2 IMPLANT
PROTECTOR NERVE ULNAR (MISCELLANEOUS) ×1 IMPLANT
RETRACTOR WND ALEXIS 18 MED (MISCELLANEOUS) IMPLANT
RETRACTOR WND ALEXIS 25 LRG (MISCELLANEOUS) ×1 IMPLANT
RTRCTR WOUND ALEXIS 18CM MED (MISCELLANEOUS) ×2
RTRCTR WOUND ALEXIS 25CM LRG (MISCELLANEOUS)
SET SUCTION IRRIG HYDROSURG (IRRIGATION / IRRIGATOR) IMPLANT
SET TUBE SMOKE EVAC HIGH FLOW (TUBING) ×1 IMPLANT
SHEARS HARMONIC ACE PLUS 36CM (ENDOMECHANICALS) IMPLANT
SLEEVE ADV FIXATION 5X100MM (TROCAR) ×1 IMPLANT
SPONGE T-LAP 18X18 ~~LOC~~+RFID (SPONGE) IMPLANT
STAPLER VISISTAT 35W (STAPLE) IMPLANT
STRIP CLOSURE SKIN 1/2X4 (GAUZE/BANDAGES/DRESSINGS) ×1 IMPLANT
SUT PDS AB 0 CTX 60 (SUTURE) IMPLANT
SUT VIC AB 0 CT1 27 (SUTURE) ×8
SUT VIC AB 0 CT1 27XBRD ANBCTR (SUTURE) ×6 IMPLANT
SUT VIC AB 2-0 CT2 27 (SUTURE) ×1 IMPLANT
SUT VIC AB 3-0 CTX 36 (SUTURE) ×2 IMPLANT
SUT VIC AB 4-0 KS 27 (SUTURE) ×1 IMPLANT
SUT VIC AB 4-0 PS2 18 (SUTURE) ×1 IMPLANT
SUT VICRYL 0 TIES 12 18 (SUTURE) IMPLANT
SUT VICRYL 0 UR6 27IN ABS (SUTURE) IMPLANT
SYR CONTROL 10ML LL (SYRINGE) ×1 IMPLANT
TOWEL OR 17X26 10 PK STRL BLUE (TOWEL DISPOSABLE) ×3 IMPLANT
TRAY FOLEY W/BAG SLVR 14FR LF (SET/KITS/TRAYS/PACK) ×2 IMPLANT
WARMER LAPAROSCOPE (MISCELLANEOUS) ×2 IMPLANT
WATER STERILE IRR 1000ML POUR (IV SOLUTION) ×1 IMPLANT

## 2022-06-28 NOTE — Transfer of Care (Signed)
Immediate Anesthesia Transfer of Care Note  Patient: Beth Vasquez  Procedure(s) Performed: Procedure(s) (LRB): EXPLORATORY LAPAROTOMY; BILATERAL SALPINGECTOMY; LEFT OOPHORECTOMY (N/A)  Patient Location: PACU  Anesthesia Type: General  Level of Consciousness: awake, alert  and oriented  Airway & Oxygen Therapy: Patient Spontanous Breathing and Patient connected to nasal cannula oxygen  Post-op Assessment: Report given to PACU RN and Post -op Vital signs reviewed and stable  Post vital signs: Reviewed and stable  Complications: No apparent anesthesia complications  Last Vitals:  Vitals Value Taken Time  BP 112/69 06/28/22 0903  Temp 37 C 06/28/22 0902  Pulse 76 06/28/22 0912  Resp 12 06/28/22 0912  SpO2 100 % 06/28/22 0912  Vitals shown include unvalidated device data.  Last Pain:  Vitals:   06/28/22 0546  TempSrc: Oral      Patients Stated Pain Goal: 6 (01/24/18 7588)  Complications: No notable events documented.

## 2022-06-28 NOTE — Progress Notes (Signed)
Assisted Dr. Royce Macadamia with transabdominal plane, ultrasound guided block. Side rails up, monitors on throughout procedure. See vital signs in flow sheet. Tolerated Procedure well.

## 2022-06-28 NOTE — Anesthesia Procedure Notes (Signed)
Anesthesia Regional Block: TAP block   Pre-Anesthetic Checklist: , timeout performed,  Correct Patient, Correct Site, Correct Laterality,  Correct Procedure, Correct Position, site marked,  Risks and benefits discussed,  Surgical consent,  Pre-op evaluation,  At surgeon's request and post-op pain management  Laterality: Left  Prep: chloraprep       Needles:  Injection technique: Single-shot  Needle Type: Echogenic Stimulator Needle     Needle Length: 10cm  Needle Gauge: 21   Needle insertion depth: 7 cm   Additional Needles:   Procedures:,,,, ultrasound used (permanent image in chart),,    Narrative:  Start time: 06/28/2022 7:20 AM End time: 06/28/2022 7:25 AM Injection made incrementally with aspirations every 5 mL.  Performed by: Personally  Anesthesiologist: Josephine Igo, MD  Additional Notes: Timeout performed. Patient sedated. Relevant anatomy ID'd using [place]. Incremental 2-34m injection of LA with frequent aspiration. Patient tolerated procedure well.

## 2022-06-28 NOTE — Brief Op Note (Signed)
06/28/2022  8:52 AM  PATIENT:  Beth Vasquez  41 y.o. female  PRE-OPERATIVE DIAGNOSIS:  dermoid cyst,STERILZATION  POST-OPERATIVE DIAGNOSIS:  dermoid cyst,STERILZATION  PROCEDURE:  Procedure(s): MINI LAPAROTOMY; BILATERAL SALPINGECTOMY; EXCISION OF DERMOID CYST (N/A)  SURGEON:  Surgeon(s) and Role:    * Bovard-Stuckert, Talli Kimmer, MD - Primary    * Shivaji, Melida Quitter, MD - Assisting   ANESTHESIA:   local, general, and TAP block  EBL:  25 mL IVF and uop per anesthesia  DRAINS: Urinary Catheter (Foley)   LOCAL MEDICATIONS USED:  MARCAINE     SPECIMEN:  Source of Specimen:  B tubal segments, L ovary with dermoid  DISPOSITION OF SPECIMEN:  PATHOLOGY  COUNTS:  YES  TOURNIQUET:  * No tourniquets in log *  DICTATION: .Other Dictation: Dictation Number [id number]  PLAN OF CARE: Admit for overnight observation  PATIENT DISPOSITION:  PACU - hemodynamically stable.   Delay start of Pharmacological VTE agent (>24hrs) due to surgical blood loss or risk of bleeding: not applicable

## 2022-06-28 NOTE — Anesthesia Procedure Notes (Signed)
Procedure Name: Intubation Date/Time: 06/28/2022 7:47 AM  Performed by: Mechele Claude, CRNAPre-anesthesia Checklist: Patient identified, Emergency Drugs available, Suction available and Patient being monitored Patient Re-evaluated:Patient Re-evaluated prior to induction Oxygen Delivery Method: Circle system utilized Preoxygenation: Pre-oxygenation with 100% oxygen Induction Type: IV induction Ventilation: Mask ventilation without difficulty Laryngoscope Size: Mac and 3 Grade View: Grade I Tube type: Oral Number of attempts: 1 Airway Equipment and Method: Stylet and Oral airway Placement Confirmation: ETT inserted through vocal cords under direct vision, positive ETCO2 and breath sounds checked- equal and bilateral Secured at: 21 cm Tube secured with: Tape Dental Injury: Teeth and Oropharynx as per pre-operative assessment

## 2022-06-28 NOTE — Anesthesia Procedure Notes (Addendum)
Anesthesia Regional Block: TAP block   Pre-Anesthetic Checklist: , timeout performed,  Correct Patient, Correct Site, Correct Laterality,  Correct Procedure, Correct Position, site marked,  Risks and benefits discussed,  Surgical consent,  Pre-op evaluation,  At surgeon's request and post-op pain management  Laterality: Right  Prep: chloraprep       Needles:  Injection technique: Single-shot  Needle Type: Echogenic Stimulator Needle     Needle Length: 10cm  Needle Gauge: 21   Needle insertion depth: 7 cm   Additional Needles:   Procedures:,,,, ultrasound used (permanent image in chart),,    Narrative:  Start time: 06/28/2022 7:20 AM End time: 06/28/2022 7:25 AM Injection made incrementally with aspirations every 5 mL.  Performed by: Personally  Anesthesiologist: Josephine Igo, MD  Additional Notes: Timeout performed. Patient sedated. Relevant anatomy ID'd using [place]. Incremental 2-50m injection of LA with frequent aspiration. Patient tolerated procedure well.

## 2022-06-28 NOTE — Interval H&P Note (Signed)
History and Physical Interval Note:  06/28/2022 7:03 AM  Beth Vasquez  has presented today for surgery, with the diagnosis of dermoid cyst,STERILZATION.  The various methods of treatment have been discussed with the patient and family. After consideration of risks, benefits and other options for treatment, the patient has consented to  Procedure(s): MINI LAPAROTOMY (N/A) LAPAROSCOPIC BILATERAL SALPINGECTOMY (Bilateral) as a surgical intervention.  The patient's history has been reviewed, patient examined, no change in status, stable for surgery.  I have reviewed the patient's chart and labs.  Questions were answered to the patient's satisfaction.     Raquel Sayres Bovard-Stuckert

## 2022-06-28 NOTE — Anesthesia Postprocedure Evaluation (Signed)
Anesthesia Post Note  Patient: Beth Vasquez  Procedure(s) Performed: EXPLORATORY LAPAROTOMY; BILATERAL SALPINGECTOMY; LEFT OOPHORECTOMY (Abdomen)     Patient location during evaluation: PACU Anesthesia Type: General Level of consciousness: awake and alert and oriented Pain management: pain level controlled Vital Signs Assessment: post-procedure vital signs reviewed and stable Respiratory status: spontaneous breathing, nonlabored ventilation and respiratory function stable Cardiovascular status: blood pressure returned to baseline and stable Postop Assessment: no apparent nausea or vomiting Anesthetic complications: no   No notable events documented.  Last Vitals:  Vitals:   06/28/22 0902 06/28/22 0915  BP: 112/69 103/68  Pulse: 82 68  Resp: 12 12  Temp: 37 C   SpO2: 100% 100%    Last Pain:  Vitals:   06/28/22 0915  TempSrc:   PainSc: Asleep                 Caroline Longie A.

## 2022-06-28 NOTE — Progress Notes (Signed)
Day of Surgery Procedure(s) (LRB): EXPLORATORY LAPAROTOMY; BILATERAL SALPINGECTOMY; LEFT OOPHORECTOMY (N/A)  Subjective: Patient reports incisional pain and tolerating PO.    Objective: I have reviewed patient's vital signs, intake and output, medications, and labs.  General: alert and no distress Resp: clear to auscultation bilaterally Cardio: regular rate and rhythm GI: soft, non-tender; bowel sounds normal; no masses,  no organomegaly Extremities: extremities normal, atraumatic, no cyanosis or edema Inc: intact, some blood or honeycomb - stable  Assessment: s/p Procedure(s): EXPLORATORY LAPAROTOMY; BILATERAL SALPINGECTOMY; LEFT OOPHORECTOMY (N/A): stable and progressing well  Plan: Advance diet Encourage ambulation Advance to PO medication Discharge home in AM - when ambulating, voiding, tolerating po, pain controlled Will d/c with ibuprofen and percocet  LOS: 0 days    Janyth Contes, MD 06/28/2022, 4:19 PM

## 2022-06-28 NOTE — Op Note (Signed)
NAMELAURIN, Beth Vasquez MEDICAL RECORD NO: [id number] ACCOUNT NO: [id number] DATE OF BIRTH: 04/28/1981 FACILITY: [facility] LOCATION: [facility] PHYSICIAN: Janyth Contes, MD  Operative Report   DATE OF PROCEDURE: 06/28/2022  PREOPERATIVE DIAGNOSES:  Dermoid cyst, undesired fertility.  POSTOPERATIVE DIAGNOSES:  Dermoid cyst, undesired fertility.  PROCEDURE:  Minilaparotomy, bilateral salpingectomy with excision of dermoid cyst.  SURGEON:  Janyth Contes, MD  ASSISTANT:  Eula Flax, MD  ANESTHESIA:  Local, general, TAP block placed before the procedure.  ESTIMATED BLOOD LOSS: Approximately 25 mL  INTRAVENOUS FLUIDS AND URINE OUTPUT:  Per anesthesia.  COMPLICATIONS:  None.  PATHOLOGY: Bilateral tubal segments and left ovary with dermoid.  DESCRIPTION OF PROCEDURE:  After informed consent was reviewed with the patient including the risks, benefits, and alternatives of the surgical procedure, she was transported to the operating room and placed on the table in supine position.  General  anesthesia was induced and found to be adequate.  She was then prepped and draped in the normal sterile fashion.  After an appropriate timeout was performed, an approximately 4 inch Pfannenstiel incision was made at the level of her previous cesarean  section incision, it was carried through to the underlying layer of fascia sharply.  The fascia was incised in the midline.  This incision was extended laterally with Mayo scissors.  Superior aspect of the fascial incision was grasped with Kocher clamps,  elevated and the rectus muscles were dissected off both bluntly and sharply.  Peritoneum was entered sharply.  Incision was extended with good visualization.  Alexis skin retractor was placed carefully making sure that no bowel was trapped.  The uterus  was palpated.  The ovary was identified on the left side and found to be approximately 4 x 3 cm enlarged as seen on ultrasound.  This was excised  using 0 Vicryl doubly ligated the fallopian tube was also excised.  The attention was then turned to the right where  the tube was also excised to the cornu.  Hemostasis was assured.  The peritoneum was reapproximated.  The fascial incision was closed in a single layer of 0 Vicryl. Subcuticular adipose layer was made hemostatic with Bovie cautery and the dead space was  closed with 3-0 Vicryl.  The skin was closed with Monocryl on a Keith needle.  Benzoin and Steris were applied.  The patient tolerated the procedure well.  Sponge, lap and needle counts were correct x2 per the operating staff.   VAI D: 06/28/2022 8:58:39 am T: 06/28/2022 9:08:00 am  JOB: [id number]/ [id number]

## 2022-06-29 DIAGNOSIS — D271 Benign neoplasm of left ovary: Secondary | ICD-10-CM | POA: Diagnosis not present

## 2022-06-29 LAB — COMPREHENSIVE METABOLIC PANEL
ALT: 12 U/L (ref 0–44)
AST: 19 U/L (ref 15–41)
Albumin: 3.6 g/dL (ref 3.5–5.0)
Alkaline Phosphatase: 38 U/L (ref 38–126)
Anion gap: 5 (ref 5–15)
BUN: 11 mg/dL (ref 6–20)
CO2: 22 mmol/L (ref 22–32)
Calcium: 8.7 mg/dL — ABNORMAL LOW (ref 8.9–10.3)
Chloride: 112 mmol/L — ABNORMAL HIGH (ref 98–111)
Creatinine, Ser: 0.73 mg/dL (ref 0.44–1.00)
GFR, Estimated: >60 mL/min (ref >60)
Glucose, Bld: 114 mg/dL — ABNORMAL HIGH (ref 70–99)
Potassium: 4 mmol/L (ref 3.5–5.1)
Sodium: 139 mmol/L (ref 135–145)
Total Bilirubin: 1.2 mg/dL (ref 0.3–1.2)
Total Protein: 6.1 g/dL — ABNORMAL LOW (ref 6.5–8.1)

## 2022-06-29 LAB — CBC
HCT: 37.8 % (ref 36.0–46.0)
Hemoglobin: 12.1 g/dL (ref 12.0–15.0)
MCH: 33.4 pg (ref 26.0–34.0)
MCHC: 32 g/dL (ref 30.0–36.0)
MCV: 104.4 fL — ABNORMAL HIGH (ref 80.0–100.0)
Platelets: 216 10*3/uL (ref 150–400)
RBC: 3.62 MIL/uL — ABNORMAL LOW (ref 3.87–5.11)
RDW: 11.9 % (ref 11.5–15.5)
WBC: 15.6 10*3/uL — ABNORMAL HIGH (ref 4.0–10.5)
nRBC: 0 % (ref 0.0–0.2)

## 2022-06-29 MED ORDER — OXYCODONE-ACETAMINOPHEN 5-325 MG PO TABS
ORAL_TABLET | ORAL | Status: AC
  Filled 2022-06-29: qty 1

## 2022-06-29 MED ORDER — OXYCODONE HCL 5 MG PO TABS
ORAL_TABLET | ORAL | Status: AC
  Filled 2022-06-29: qty 1

## 2022-06-29 MED ORDER — IBUPROFEN 800 MG PO TABS
ORAL_TABLET | ORAL | Status: AC
  Filled 2022-06-29: qty 1

## 2022-06-29 MED ORDER — IBUPROFEN 800 MG PO TABS: 800.0000 mg | ORAL_TABLET | Freq: Three times a day (TID) | ORAL | 1 refills | Status: DC | PRN

## 2022-06-29 MED ORDER — OXYCODONE-ACETAMINOPHEN 5-325 MG PO TABS: 1.0000 | ORAL_TABLET | Freq: Four times a day (QID) | ORAL | 0 refills | Status: DC | PRN

## 2022-06-29 NOTE — Discharge Summary (Signed)
Physician Discharge Summary  Patient ID: Beth Vasquez MRN: [id number] DOB/AGE: 1980/08/14 41 y.o.  Admit date: 06/28/2022 Discharge date: 06/29/2022  Admission Diagnoses: dermoid cyst, undesired fertility  Discharge Diagnoses:  Principal Problem:   S/P left oophorectomy Active Problems:   Dermoid cyst of left ovary   S/P tubal ligation   Discharged Condition: good  Hospital Course: admitted for surgery underwent without complication, discharged to home POD#1 ambulating, voiding, tolerating po, pain controlled. Labs stable.    Consults: None  Significant Diagnostic Studies: labs: CBC, BMP  Treatments: surgery: exlap, LSO, R salpingectomy  Discharge Exam: Blood pressure 93/65, pulse 63, temperature 98.7 F (37.1 C), temperature source Oral, resp. rate 14, height '5\' 4"'$  (1.626 m), weight 56.1 kg, last menstrual period 06/06/2022, SpO2 100 %. General appearance: alert and no distress Resp: clear to auscultation bilaterally Cardio: regular rate and rhythm GI: soft, non-tender; bowel sounds normal; no masses,  no organomegaly Extremities: extremities normal, atraumatic, no cyanosis or edema Incision/Wound:C/D/I (staining on steri strips)  Disposition: Discharge disposition: 01-Home or Self Care       Discharge Instructions     Call MD for:  persistant nausea and vomiting   Complete by: As directed    Call MD for:  redness, tenderness, or signs of infection (pain, swelling, redness, odor or green/yellow discharge around incision site)   Complete by: As directed    Call MD for:  severe uncontrolled pain   Complete by: As directed    Diet - low sodium heart healthy   Complete by: As directed    Discharge instructions   Complete by: As directed    Call [phone] with questions or problems   Discharge wound care:   Complete by: As directed    Remove dressing 48 hours after discharge.  Steristrips will peel off or you can remove them in the shower in 7-10 days    Driving Restrictions   Complete by: As directed    While taking strong pain medicine   Increase activity slowly   Complete by: As directed    Lifting restrictions   Complete by: As directed    No greater than 10-15lbs for 6 weeks   May shower / Bathe   Complete by: As directed    May walk up steps   Complete by: As directed    Sexual Activity Restrictions   Complete by: As directed    Pelvic rest - no douching, tampons or sex while having vaginal bleeding/per discomfort from abdominal incision      Allergies as of 06/29/2022   No Known Allergies      Medication List     TAKE these medications    ALKA-SELTZER ANTACID PO Take by mouth as needed.   FLUoxetine 10 MG tablet Commonly known as: PROZAC Take 10 mg by mouth daily.   fluticasone 50 MCG/ACT nasal spray Commonly known as: FLONASE Place 1 spray into both nostrils daily.   ibuprofen 800 MG tablet Commonly known as: ADVIL Take 1 tablet (800 mg total) by mouth every 8 (eight) hours as needed (mild pain).   Lacosamide 150 MG Tabs TAKE 1 TABLET TWICE A DAY   oxyCODONE-acetaminophen 5-325 MG tablet Commonly known as: PERCOCET/ROXICET Take 1-2 tablets by mouth every 6 (six) hours as needed for severe pain (moderate to severe pain (when tolerating fluids)).   WOMENS MULTIVITAMIN PLUS PO Take 1 tablet by mouth daily.   zonisamide 100 MG capsule Commonly known as: ZONEGRAN Take 1 capsule (100 mg total) by  mouth at bedtime.               Discharge Care Instructions  (From admission, onward)           Start     Ordered   06/29/22 0000  Discharge wound care:       Comments: Remove dressing 48 hours after discharge.  Steristrips will peel off or you can remove them in the shower in 7-10 days   06/29/22 0830            Follow-up Information     Bovard-Stuckert, Jeral Fruit, MD Follow up.   Specialty: Obstetrics and Gynecology Why: 2 and 6 weeks for postop check Contact information: [address] [place] [address] [phone]                 Signed: Janyth Contes 06/29/2022, 8:30 AM

## 2022-06-29 NOTE — Progress Notes (Signed)
1 Day Post-Op Procedure(s) (LRB): EXPLORATORY LAPAROTOMY; BILATERAL SALPINGECTOMY; LEFT OOPHORECTOMY (N/A)  Subjective: Patient reports incisional pain, tolerating PO, and no problems voiding.  Pain is controlled with po meds  Objective: I have reviewed patient's vital signs, intake and output, medications, and labs.  General: alert and no distress Resp: clear to auscultation bilaterally Cardio: regular rate and rhythm GI: soft, non-tender; bowel sounds normal; no masses,  no organomegaly and incision: clean, dry, and intact Extremities: extremities normal, atraumatic, no cyanosis or edema  Assessment: s/p Procedure(s): EXPLORATORY LAPAROTOMY; BILATERAL SALPINGECTOMY; LEFT OOPHORECTOMY (N/A): stable and progressing well  Plan: Encourage ambulation Discharge home with Motrin and Percocer, f/u 2 and 6 weeks  LOS: 0 days    Janyth Contes, MD 06/29/2022, 8:18 AM

## 2022-07-02 ENCOUNTER — Encounter (HOSPITAL_BASED_OUTPATIENT_CLINIC_OR_DEPARTMENT_OTHER): Payer: Self-pay | Admitting: Obstetrics and Gynecology

## 2022-07-02 LAB — SURGICAL PATHOLOGY

## 2022-07-27 ENCOUNTER — Encounter: Payer: Self-pay | Admitting: Family Medicine

## 2022-07-30 ENCOUNTER — Other Ambulatory Visit: Payer: Self-pay | Admitting: *Deleted

## 2022-07-30 DIAGNOSIS — G40109 Localization-related (focal) (partial) symptomatic epilepsy and epileptic syndromes with simple partial seizures, not intractable, without status epilepticus: Secondary | ICD-10-CM

## 2022-07-31 MED ORDER — LACOSAMIDE 150 MG PO TABS: 1.0000 | ORAL_TABLET | Freq: Two times a day (BID) | ORAL | 0 refills | Status: DC

## 2022-08-07 ENCOUNTER — Other Ambulatory Visit: Payer: Self-pay | Admitting: *Deleted

## 2022-08-07 MED ORDER — LACOSAMIDE 150 MG PO TABS: 1.0000 | ORAL_TABLET | Freq: Two times a day (BID) | ORAL | 0 refills | Status: DC

## 2022-10-14 ENCOUNTER — Other Ambulatory Visit: Payer: Self-pay | Admitting: Neurology

## 2022-10-17 NOTE — Telephone Encounter (Signed)
Requested Prescriptions   Pending Prescriptions Disp Refills   Lacosamide 150 MG TABS [Pharmacy Med Name: LACOSAMIDE 150 MG TABLET] 14 tablet 0    Sig: TAKE 1 TABLET BY MOUTH TWICE A DAY   Pt last seen at [facility] by Debbora Presto NP on 04/18/22. No upcoming appointment has been scheduled but she will be due to be seen again in 1 year as dictated in Lomax's assessment and plan portion of her progress not on that 04/18/22. Routing to provider to refill.  ROUTING TO PROVIDER TO FILL.  Prior Dispenses:   Dispensed Days Supply Quantity Provider Pharmacy  LACOSAMIDE 150MG  TAB 07/31/2022 90 180 tablet Lomax, Amy, NP [facility]  LACOSAMIDE 150MG  TAB 05/04/2022 90 180 tablet Star Age, MD [facility]  LACOSAMIDE 150MG  TAB 01/18/2022 90 180 tablet Star Age, MD [facility]Marland KitchenMarland Kitchen

## 2023-05-06 ENCOUNTER — Other Ambulatory Visit: Payer: Self-pay | Admitting: Family Medicine

## 2023-05-06 MED ORDER — LACOSAMIDE 150 MG PO TABS: 1.0000 | ORAL_TABLET | Freq: Two times a day (BID) | ORAL | 0 refills | Status: DC

## 2023-05-06 NOTE — Telephone Encounter (Signed)
Pt states she does not have enough Lacosamide 150 MG TABS to get her to her appointment this week, pt is asking if the medication can be filled now or at least enough until Wednesday's appointment.

## 2023-05-06 NOTE — Telephone Encounter (Signed)
Last seen on 04/18/22 (video visit) Follow up scheduled on 05/08/23  I called patient to find out which pharmacy she would like Rx sent. Pt said [facility].  Last filled on 01/29/23 # 180 tablets (90 day supply)  Rx pending to be signed

## 2023-05-08 ENCOUNTER — Ambulatory Visit (INDEPENDENT_AMBULATORY_CARE_PROVIDER_SITE_OTHER): Payer: No Typology Code available for payment source | Admitting: Family Medicine

## 2023-05-08 ENCOUNTER — Encounter: Payer: Self-pay | Admitting: Family Medicine

## 2023-05-08 DIAGNOSIS — G40109 Localization-related (focal) (partial) symptomatic epilepsy and epileptic syndromes with simple partial seizures, not intractable, without status epilepticus: Secondary | ICD-10-CM | POA: Diagnosis not present

## 2023-05-08 MED ORDER — ZONISAMIDE 100 MG PO CAPS: 100.0000 mg | ORAL_CAPSULE | Freq: Every day | ORAL | 3 refills | Status: DC

## 2023-05-08 MED ORDER — LACOSAMIDE 150 MG PO TABS: 1.0000 | ORAL_TABLET | Freq: Two times a day (BID) | ORAL | 1 refills | Status: DC

## 2023-05-08 NOTE — Progress Notes (Signed)
Chief Complaint  Beth presents with   Room 1    Pt is here Alone. Pt states that things have been going good since her last appointment. Pt states she doesn't have any new questions or concerns to discuss today.     HISTORY OF PRESENT ILLNESS:  05/08/23 ALL:  Beth Vasquez is a 42 y.o. Vasquez here today for follow up for seizures. She was last seen via Mychart 04/2022 and advised to continue zonisamide and lacosamide. Since, she reports doing well. No seizure events. No concerns of kidney stones. She was seen by urology but has not followed up recently. She is sleeping well. Mood well managed on Prozac. She is followed by PCP regularly.   04/18/22 ALL (Mychart): Beth Vasquez for seizure follow up. She was last seen 06/2021 and doing well on zonisamide 100mg  QHS and lacosamide 150mg  BID. Last seizure event was in 2012. She called 02/2022 reporting a kidney stone and requested to wean zonisamide. We did not hear back from her until 04/16/2022 when she had questions regarding anxiety medications that can be used in seizure patients. She has been using magnesium OTC with little benefit. She recently started seeing a counselor. She is interested in considering a mild antidepressant/anti anxiety medication. She reports seizures are very well managed. No events since last being seen. She does not wish to wean zonisamide. She understands increased risks of kidney stone production. She has had two kidney stones in the past 5 years, no previous history. She is not followed by urology. No history of lithotripsy. She has failed lev due to worsening depression and lamotrigine caused abnormal/scary visions.   07/04/2021 ALL (Mychart): Beth Vasquez is a 42 y.o. Vasquez here today for follow up for seizures. Last GTC seizure 2012. Partial seizure (aphasia) stable. She continues to tolerate lacosamide 150mg  BID and zonisamide 100mg  QHS. She is doing well. No recent events of aphasia. She feels events  were associated with prolonged study times. She is tolerating medications. No concerns.   History (copied from Dr Richrd Humbles previous note)  UPDATE (05/10/20, VRP): Since last visit, doing well. No seizures. 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 Now back to baseline. Tolerating meds.    UPDATE (05/05/19, VRP): Since last visit, doing well. Symptoms are stable. No seizures or auras. No alleviating or aggravating factors. Tolerating meds.     UPDATE (09/09/18, VRP): Since last visit, doing well. Now on improved exercise program and feels much better. Also improving her nutrition. Memory lapses are stable. Aphasia attacks have stopped. Tolerating meds. No major seizures.     UPDATE (03/04/18, VRP): Since last visit, doing Beth Vasquez of transient aphasia lasting less than 1 minute. Symptoms are moderate.  Beth does note persistent mild hair loss.  She also notes ongoing slightly worsening memory loss in conversations with family and friends.  Beth notes that carbohydrate intake and rest affect the frequency of these spells.  No other alleviating or aggravating factors. Tolerating Vimpat and zonisamide.   UPDATE 02/15/17: Since last visit Beth doing well. No major seizures. Beth has rare aphasia spells if she increases her carbohydrate intake or is not get proper rest. Beth has noticed more hair loss in the last 1 year.   UPDATE 02/15/16: Since last visit, doing well. Seizures are less frequent (more than 1 Vasquez per event). Hydration, low carbs, rest seem to  help avoid seizures. Tolerating meds. No kidney stone issues. Has gained 10lbs which was desirable for Beth.    UPDATE 07/07/15: Since last visit, still with small clusters of aphasia events (3-4 events in a day; once per Vasquez). Overall stable. Had kidney stone (severe) August 2016, went to ER. No recurrent stones.     UPDATE 12/28/14: Since last visit, doing much better. Went to [facility] for second opinion, continued vimpat, and then LEV was switched to [facility]. A few months ago, tried cutting out sugars/carb to lose some weight, and noticed that her seizures almost stopped. Now occ if she has sugars for 2-3 days, then she may have a few breakthrough partial seizures (aphasia x 20 sec).    UPDATE 11/03/13: Since last visit, continued on LTG 100mg  BID, but started having anxiety attacks and "disturbing" images in her mind (knife etc). She was advised to taper off LTG by my colleagues in my absence. Since that time, she has come down to LTG 25mg  BID, and the anxiety / images have subsided. Now her aphasia / partial seizures are back up to 2 per day. No grand mal seizures since 2012.   UPDATE 10/14/13: Since last visit, tried vimpat but was too expensive. Now on LTG 100mg  BID, and doing better. At LTG 75mg  BID, was event free x 2 weeks. Now on LTG 100mg  BID x 2 weeks, and only had 1 event yesterday. Events are consistent with 30 seconds of exp and receptive aphasia. No confusion, convulsions or staring. Able to walk, move and function. She is awake/aware when these are occuring.    UPDATE 07/22/13 (LL): Beth Vasquez for earlier revisit due to increased partial seizures. She has almost daily episodes of being unable to speak and staring, but knows what is going on around her. Other times she gets an aura of a seizure, with a deja vu feeling, but then it never happens. Sometimes this is followed by headache and tiredness. Her dose of Keppra is 250 mg in the morning and 500 mg at night. At higher dose she states she had depression, worse ringing in the ears and hair falling out. Her last EEG was in [place] and was video-monitored. She thinks her last MRI was 2 years ago.    PRIOR HPI 02/04/13 (VP): 42 year old right-handed Vasquez here for evaluation of seizure disorder. Beth was born full-term, no complications with normal  development. Family history seizure only notable in Beth's mother's uncle. Beth has had 2 episodes of seizure in her life on 01/04/11 and 03/20/11. First seizure, Beth was in the car with her husband and suddenly became unresponsive without warning. She has generalized convulsions. Took her about 20 minutes to regain consciousness. She was slowing confused for several hours. She bit her tongue but did not have incontinence. Second episode occurred in a similar fashion to the first. Following the second seizure Beth started on levetiracetam 500 mg twice a day. Within a few months, Beth was complaining of depression and being in the ears as well as hair loss and therefore levetiracetam dose was decreased to 500 mg the morning and 500 mg at night. Around this time Beth began to have intermittent episodes lasting 1 minute at a time, of expressive and receptive aphasia. She did not lose consciousness. She is able to stand up sit down involuntary move her arms and legs. Sometimes she is able to get one or 2 words out. These events occur once per week, in clusters of up to 5 in a  day.  Beth had video EEG monitoring in [place], which she thinks showed left frontal epileptiform discharges. Beth was pregnant in 2013, and stayed on levetiracetam during the pregnancy. She had no seizures during pregnancy. She gave birth to 2 healthy twin girls, who are now 71 months old. Beth reports that she has not had significant seizures in about two years. Recently, she has been having seizures and would like to be assessed and medication changes made as appropriate.    REVIEW OF SYSTEMS: Out of a complete 14 system review of symptoms, the Beth complains only of the following symptoms, none and all other reviewed systems are negative.   ALLERGIES: No Known Allergies   HOME MEDICATIONS: Outpatient Medications Prior to Visit  Medication Sig Dispense Refill   Calcium Carbonate Antacid (ALKA-SELTZER  ANTACID PO) Take by mouth as needed.     FLUoxetine (PROZAC) 10 MG tablet Take 20 mg by mouth daily.     fluticasone (FLONASE) 50 MCG/ACT nasal spray Place 1 spray into both nostrils daily.     Multiple Vitamins-Minerals (WOMENS MULTIVITAMIN PLUS PO) Take 1 tablet by mouth daily.     Lacosamide 150 MG TABS Take 1 tablet (150 mg total) by mouth 2 (two) times daily. 180 tablet 0   zonisamide (ZONEGRAN) 100 MG capsule Take 1 capsule (100 mg total) by mouth at bedtime. (Beth taking differently: Take 100 mg by mouth at bedtime.) 90 capsule 3   ibuprofen (ADVIL) 800 MG tablet Take 1 tablet (800 mg total) by mouth every 8 (eight) hours as needed (mild pain). 45 tablet 1   oxyCODONE-acetaminophen (PERCOCET/ROXICET) 5-325 MG tablet Take 1-2 tablets by mouth every 6 (six) hours as needed for severe pain (moderate to severe pain (when tolerating fluids)). 20 tablet 0   No facility-administered medications prior to visit.     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   neurologist--- dr penumalli/ Blen Ransome np--   hx 2 seizures 06/ 2012 and 09/ 2012,  pt stated last seizure approx 2018   PMDD (premenstrual dysphoric disorder)    PONV (postoperative nausea and vomiting)      PAST SURGICAL HISTORY: Past Surgical History:  Procedure Laterality Date   ABDOMINOPLASTY  2022   AUGMENTATION MAMMAPLASTY Bilateral 2006   saline   CESAREAN SECTION  2013   HYSTEROSCOPY WITH D & C  08/2011   LAPAROTOMY N/A 06/28/2022   Procedure: EXPLORATORY LAPAROTOMY; BILATERAL SALPINGECTOMY; LEFT OOPHORECTOMY;  Surgeon: Sherian Rein, MD;  Location: [facility];  Service: Gynecology;  Laterality: N/A;     FAMILY HISTORY: Family History  Problem Relation Age of Onset   Breast cancer Mother     Seizures Maternal Uncle    Hypertension Brother      SOCIAL HISTORY: Social History   Socioeconomic History   Marital status: Married    Spouse name: Onalee Hua   Number of children: 2   Years of education: [employer] education level: Not on file  Occupational History   Occupation: N/A  Tobacco Use   Smoking status: Never   Smokeless tobacco: Never  Vaping Use   Vaping status: Never Used  Substance and Sexual Activity   Alcohol use: Not Currently    Comment: occasionally   Drug use: Yes  Types: Marijuana    Comment: occasionally smoke marijuana,  last smoked 06-23-2022   Sexual activity: Not on file    Comment: husband-vasectomy  Other Topics Concern   Not on file  Social History Narrative   Beth lives at home with family.   Caffeine Use: 1 tea or coffee daily in a.m   Social Determinants of Health   Financial Resource Strain: Not on file  Food Insecurity: Not on file  Transportation Needs: Not on file  Physical Activity: Not on file  Stress: Not on file  Social Connections: Not on file  Intimate Partner Violence: Not on file     PHYSICAL EXAM  Vitals:   05/08/23 1014  BP: 109/73  Pulse: 77  Weight: 131 lb 8 oz (59.6 kg)  Height: 5\' 4"  (1.626 m)   Body mass index is 22.57 kg/m.  Generalized: Well developed, in no acute distress  Cardiology: normal rate and rhythm, no murmur auscultated  Respiratory: clear to auscultation bilaterally    Neurological examination  Mentation: Alert oriented to time, place, history taking. Follows all commands speech and language fluent Cranial nerve II-XII: Pupils were equal round reactive to light. Extraocular movements were full, visual field were full on confrontational test. Facial sensation and strength were normal. Uvula tongue midline. Head turning and shoulder shrug  were normal and symmetric. Motor: The motor testing reveals 5 over 5 strength of all 4 extremities. Good symmetric motor tone is noted  throughout.  Sensory: Sensory testing is intact to soft touch on all 4 extremities. No evidence of extinction is noted.  Coordination: Cerebellar testing reveals good finger-nose-finger and heel-to-shin bilaterally.  Gait and station: Gait is normal.  Reflexes: Deep tendon reflexes are symmetric and normal bilaterally.    DIAGNOSTIC DATA (LABS, IMAGING, TESTING) - I reviewed Beth records, labs, notes, testing and imaging myself where available.  Lab Results  Component Value Date   WBC 15.6 (H) 06/29/2022   HGB 12.1 06/29/2022   HCT 37.8 06/29/2022   MCV 104.4 (H) 06/29/2022   PLT 216 06/29/2022      Component Value Date/Time   NA 139 06/29/2022 0159   K 4.0 06/29/2022 0159   CL 112 (H) 06/29/2022 0159   CO2 22 06/29/2022 0159   GLUCOSE 114 (H) 06/29/2022 0159   BUN 11 06/29/2022 0159   CREATININE 0.73 06/29/2022 0159   CALCIUM 8.7 (L) 06/29/2022 0159   PROT 6.1 (L) 06/29/2022 0159   ALBUMIN 3.6 06/29/2022 0159   AST 19 06/29/2022 0159   ALT 12 06/29/2022 0159   ALKPHOS 38 06/29/2022 0159   BILITOT 1.2 06/29/2022 0159   GFRNONAA >60 06/29/2022 0159   GFRAA >60 02/21/2015 1030   No results found for: "CHOL", "HDL", "LDLCALC", "LDLDIRECT", "TRIG", "CHOLHDL" No results found for: "HGBA1C" No results found for: "VITAMINB12" No results found for: "TSH"      No data to display               No data to display           ASSESSMENT AND PLAN  43 y.o. year old Vasquez  has a past medical history of Dermoid cyst of ovary, GAD (generalized anxiety disorder), GERD (gastroesophageal reflux disease), History of kidney stones, Localization-related epilepsy (HCC) (2012), PMDD (premenstrual dysphoric disorder), and PONV (postoperative nausea and vomiting). here with    Localization-related epilepsy (HCC) - Plan: zonisamide (ZONEGRAN) 100 MG capsule  Beth Vasquez is doing well. We will continue zonisamide 100mg  at bedtime and  lacosamide 150mg  BID. She recently filled  [facility], written for 90 day supply. I have encouraged her to contact pharmacist to see if she can fill remaining quantity. New Rx send to [facility] for continued refills. She will follow up closely with urology for any concerns of kidney stones. Healthy lifestyle habits encouraged. She will follow up with PCP as directed. She will return to see me in 1 year, sooner if needed. She verbalizes understanding and agreement with this plan.   No orders of the defined types were placed in this encounter.    Meds ordered this encounter  Medications   zonisamide (ZONEGRAN) 100 MG capsule    Sig: Take 1 capsule (100 mg total) by mouth at bedtime.    Dispense:  90 capsule    Refill:  3    Order Specific Question:   Supervising Provider    Answer:   Anson Fret [id number]   Lacosamide 150 MG TABS    Sig: Take 1 tablet (150 mg total) by mouth 2 (two) times daily.    Dispense:  180 tablet    Refill:  1    [facility]    Order Specific Question:   Supervising Provider    Answer:   Anson Fret [[id number]]     Shawnie Dapper, MSN, FNP-C 05/08/2023, 10:58 AM  [facility] [address] [place], [place] [address] [phone]

## 2023-05-08 NOTE — Patient Instructions (Signed)
Below is our plan:  We will continue zonisamide 100mg  at bedtime and lacosamide 150mg  twice daily. Make sure your urologist knows you are taking zonisamide. Continue close follow up with PCP,   Please make sure you are consistent with timing of seizure medication. I recommend annual visit with primary care provider (PCP) for complete physical and routine blood work. I recommend daily intake of vitamin D (400-800iu) and calcium (800-1000mg ) for bone health. Discuss Dexa screening with PCP.   According to [place] law, you can not drive unless you are seizure / syncope free for at least 6 months and under physician's care.  Please maintain precautions. Do not participate in activities where a loss of awareness could harm you or someone else. No swimming alone, no tub bathing, no hot tubs, no driving, no operating motorized vehicles (cars, ATVs, motocycles, etc), lawnmowers, power tools or firearms. No standing at heights, such as rooftops, ladders or stairs. Avoid hot objects such as stoves, heaters, open fires. Wear a helmet when riding a bicycle, scooter, skateboard, etc. and avoid areas of traffic. Set your water heater to 120 degrees or less.  SUDEP is the sudden, unexpected death of someone with epilepsy, who was otherwise healthy. In SUDEP cases, no other cause of death is found when an autopsy is done. Each year, more than 1 in 1,000 people with epilepsy die from SUDEP. This is the leading cause of death in people with uncontrolled seizures. Until further answers are available, the best way to prevent SUDEP is to lower your risk by controlling seizures. Research has found that people with all types of epilepsy that experience convulsive seizures can be at risk.  Please make sure you are staying well hydrated. I recommend 50-60 ounces daily. Well balanced diet and regular exercise encouraged. Consistent sleep schedule with 6-8 hours recommended.   Please continue follow up with care team as directed.    Follow up with me in 1 year   You may receive a survey regarding today's visit. I encourage you to leave honest feed back as I do use this information to improve patient care. Thank you for seeing me today!

## 2023-05-14 ENCOUNTER — Telehealth: Payer: Self-pay | Admitting: Family Medicine

## 2023-05-14 NOTE — Telephone Encounter (Signed)
[facility]aine Leric[facility]) calling in regards to  Lacosamide 150 MG TABS requesting supervising physician, Dr. Desma Maxim Ahern's DEA number. Would like a call back. Reference no: [id number]

## 2023-05-14 NOTE — Telephone Encounter (Signed)
[facility]ed [facility] and spoke with Rose. I provided Dr Trevor Mace DEA #. She verbalized appreciation.

## 2023-07-16 ENCOUNTER — Other Ambulatory Visit: Payer: Self-pay

## 2023-07-18 ENCOUNTER — Other Ambulatory Visit: Payer: Self-pay | Admitting: *Deleted

## 2023-07-18 DIAGNOSIS — G40109 Localization-related (focal) (partial) symptomatic epilepsy and epileptic syndromes with simple partial seizures, not intractable, without status epilepticus: Secondary | ICD-10-CM

## 2023-07-18 MED ORDER — ZONISAMIDE 100 MG PO CAPS: 100.0000 mg | ORAL_CAPSULE | Freq: Every day | ORAL | 1 refills | Status: DC

## 2023-07-18 NOTE — Telephone Encounter (Signed)
 Last seen on 05/08/23 Follow up scheduled on 05/14/24

## 2023-11-25 ENCOUNTER — Other Ambulatory Visit: Payer: Self-pay | Admitting: *Deleted

## 2023-11-25 MED ORDER — LACOSAMIDE 150 MG PO TABS: 1.0000 | ORAL_TABLET | Freq: Two times a day (BID) | ORAL | 1 refills | Status: DC

## 2023-11-27 ENCOUNTER — Telehealth: Payer: Self-pay | Admitting: Family Medicine

## 2023-11-27 NOTE — Telephone Encounter (Signed)
 Called pt. LVM letting her know this has been resolved. Asked her to call back if any further issues.

## 2023-11-27 NOTE — Telephone Encounter (Signed)
 Called back and spoke w/ pharmacist. They needed supervising NPI/DEA. Relayed this would be Dr. Gwendloyn Lemming and provided his info. They verbalized understanding. Nothing further needed.

## 2023-11-27 NOTE — Telephone Encounter (Signed)
 Pt called, have not received medication. Informed patient phone staff was getting required information (DEA number) pharmacy hung up.   Patient said she would call the pharmacy. Gave patient the DEA number to relay in case they still need to number. Patient called back pharmacy informed due to is a controlled substance nurse would have to call back to [phone]. Patient said only 7 days left of Lacosamide  150 MG TABS

## 2023-11-27 NOTE — Telephone Encounter (Signed)
 [facility] called stating That order was senT with no DEA#  and wanted Clarification about order. Representative hung up before I can give number.  If call back DEA # [id number]

## 2024-05-12 NOTE — Patient Instructions (Signed)
 Below is our plan:  We will continue zonisamide  100mg  at bedtime and lacosamide  150mg  twice daily.   Please make sure you are consistent with timing of seizure medication. I recommend annual visit with primary care provider (PCP) for complete physical and routine blood work. I recommend daily intake of vitamin D (400-800iu) and calcium (800-1000mg ) for bone health. Discuss Dexa screening with PCP.   According to [place] law, you can not drive unless you are seizure / syncope free for at least 6 months and under physician's care.  Please maintain precautions. Do not participate in activities where a loss of awareness could harm you or someone else. No swimming alone, no tub bathing, no hot tubs, no driving, no operating motorized vehicles (cars, ATVs, motocycles, etc), lawnmowers, power tools or firearms. No standing at heights, such as rooftops, ladders or stairs. Avoid hot objects such as stoves, heaters, open fires. Wear a helmet when riding a bicycle, scooter, skateboard, etc. and avoid areas of traffic. Set your water  heater to 120 degrees or less.  SUDEP is the sudden, unexpected death of someone with epilepsy, who was otherwise healthy. In SUDEP cases, no other cause of death is found when an autopsy is done. Each year, more than 1 in 1,000 people with epilepsy die from SUDEP. This is the leading cause of death in people with uncontrolled seizures. Until further answers are available, the best way to prevent SUDEP is to lower your risk by controlling seizures. Research has found that people with all types of epilepsy that experience convulsive seizures can be at risk.  Please make sure you are staying well hydrated. I recommend 50-60 ounces daily. Well balanced diet and regular exercise encouraged. Consistent sleep schedule with 6-8 hours recommended.   Please continue follow up with care team as directed.   Follow up with me in 1 year   You may receive a survey regarding today's visit. I  encourage you to leave honest feed back as I do use this information to improve patient care. Thank you for seeing me today!

## 2024-05-12 NOTE — Progress Notes (Unsigned)
 PATIENT: Beth Vasquez DOB: 14-Jul-1981  REASON FOR VISIT: follow up HISTORY FROM: patient  Virtual Visit via Mychart Video Note  I connected with Shaketa Maahs on 05/14/24 at  9:30 AM EDT by Mychart video and verified that I am speaking with the correct person using two identifiers.   I discussed the limitations, risks, security and privacy concerns of performing an evaluation and management service by video and the availability of in person appointments. I also discussed with the patient that there may be a patient responsible charge related to this service. The patient expressed understanding and agreed to proceed.   History of Present Illness:  05/14/24 ALL (Mychart): Beth Vasquez returns for follow up for seizures. She was last seen 04/2023 and doing well on zonisamide  and lacosamide . Since, she continues to do well. She denies seizure activity. She stays well hydrated. Getting plenty of sleep. Mood well managed. She cut out caffeine. She has moved to [place]. Followed by PCP and GYN regularly. She drives without difficulty.   05/08/23 ALL:  Beth Vasquez is a 43 y.o. female here today for follow up for seizures. She was last seen via Mychart 04/2022 and advised to continue zonisamide  and lacosamide . Since, she reports doing well. No seizure events. No concerns of kidney stones. She was seen by urology but has not followed up recently. She is sleeping well. Mood well managed on Prozac . She is followed by PCP regularly.   04/18/2022 ALL (Mychart): Beth Vasquez returns for seizure follow up. She was last seen 06/2021 and doing well on zonisamide  100mg  QHS and lacosamide  150mg  BID. Last seizure event was in 2012. She called 02/2022 reporting a kidney stone and requested to wean zonisamide . We did not hear back from her until 04/16/2022 when she had questions regarding anxiety medications that can be used in seizure patients. She has been using magnesium OTC with little benefit. She recently  started seeing a counselor. She is interested in considering a mild antidepressant/anti anxiety medication. She reports seizures are very well managed. No events since last being seen. She does not wish to wean zonisamide . She understands increased risks of kidney stone production. She has had two kidney stones in the past 5 years, no previous history. She is not followed by urology. No history of lithotripsy. She has failed lev due to worsening depression and lamotrigine  caused abnormal/scary visions.   07/04/2021 ALL: Beth Vasquez is a 43 y.o. female here today for follow up for seizures. Last GTC seizure 2012. Partial seizure (aphasia) stable. She continues to tolerate lacosamide  150mg  BID and zonisamide  100mg  QHS. She is doing well. No recent events of aphasia. She feels events were associated with prolonged study times. She is tolerating medications. No concerns.   History (copied from Dr Chancy previous note)  UPDATE (05/10/20, VRP): Since last visit, doing well. No seizures. 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 Now back to baseline. Tolerating meds.    UPDATE (05/05/19, VRP): Since last visit, doing well. Symptoms are stable. No seizures or auras. No alleviating or aggravating factors. Tolerating meds.     UPDATE (09/09/18, VRP): Since last visit, doing well. Now on improved exercise program and feels much better. Also improving her nutrition. Memory lapses are stable. Aphasia attacks have stopped. Tolerating meds. No major seizures.     UPDATE (03/04/18, VRP): Since last visit, doing patient continues to have one spell per month of transient aphasia lasting  less than 1 minute. Symptoms are moderate.  Patient does note persistent mild hair loss.  She also notes ongoing slightly worsening memory loss in conversations with family and friends.  Patient notes that carbohydrate intake and rest affect the frequency of  these spells.  No other alleviating or aggravating factors. Tolerating Vimpat  and zonisamide .   UPDATE 02/15/17: Since last visit patient doing well. No major seizures. Patient has rare aphasia spells if she increases her carbohydrate intake or is not get proper rest. Patient has noticed more hair loss in the last 1 year.   UPDATE 02/15/16: Since last visit, doing well. Seizures are less frequent (more than 1 month per event). Hydration, low carbs, rest seem to help avoid seizures. Tolerating meds. No kidney stone issues. Has gained 10lbs which was desirable for patient.    UPDATE 07/07/15: Since last visit, still with small clusters of aphasia events (3-4 events in a day; once per month). Overall stable. Had kidney stone (severe) August 2016, went to ER. No recurrent stones.    UPDATE 12/28/14: Since last visit, doing much better. Went to [facility] for second opinion, continued vimpat , and then LEV was switched to ZNG. A few months ago, tried cutting out sugars/carb to lose some weight, and noticed that her seizures almost stopped. Now occ if she has sugars for 2-3 days, then she may have a few breakthrough partial seizures (aphasia x 20 sec).    UPDATE 11/03/13: Since last visit, continued on LTG 100mg  BID, but started having anxiety attacks and disturbing images in her mind (knife etc). She was advised to taper off LTG by my colleagues in my absence. Since that time, she has come down to LTG 25mg  BID, and the anxiety / images have subsided. Now her aphasia / partial seizures are back up to 2 per day. No grand mal seizures since 2012.   UPDATE 10/14/13: Since last visit, tried vimpat  but was too expensive. Now on LTG 100mg  BID, and doing better. At LTG 75mg  BID, was event free x 2 weeks. Now on LTG 100mg  BID x 2 weeks, and only had 1 event yesterday. Events are consistent with 30 seconds of exp and receptive aphasia. No confusion, convulsions or staring. Able to walk, move and function. She is awake/aware when  these are occuring.    UPDATE 07/22/13 (LL): Patient returns for earlier revisit due to increased partial seizures. She has almost daily episodes of being unable to speak and staring, but knows what is going on around her. Other times she gets an aura of a seizure, with a deja vu feeling, but then it never happens. Sometimes this is followed by headache and tiredness. Her dose of Keppra  is 250 mg in the morning and 500 mg at night. At higher dose she states she had depression, worse ringing in the ears and hair falling out. Her last EEG was in [place]  and was video-monitored. She thinks her last MRI was 2 years ago.    PRIOR HPI 02/04/13 (VP): 43 year old right-handed female here for evaluation of seizure disorder. Patient was born full-term, no complications with normal development. Family history seizure only notable in patient's mother's uncle. Patient has had 2 episodes of seizure in her life on 01/04/11 and 03/20/11. First seizure, patient was in the car with her husband and suddenly became unresponsive without warning. She has generalized convulsions. Took her about 20 minutes to regain consciousness. She was slowing confused for several hours. She bit her tongue but did not have incontinence. Second  episode occurred in a similar fashion to the first. Following the second seizure patient started on levetiracetam  500 mg twice a day. Within a few months, patient was complaining of depression and being in the ears as well as hair loss and therefore levetiracetam  dose was decreased to 500 mg the morning and 500 mg at night. Around this time patient began to have intermittent episodes lasting 1 minute at a time, of expressive and receptive aphasia. She did not lose consciousness. She is able to stand up sit down involuntary move her arms and legs. Sometimes she is able to get one or 2 words out. These events occur once per week, in clusters of up to 5 in a day.  Patient had video EEG monitoring in [place] , which she  thinks showed left frontal epileptiform discharges. Patient was pregnant in 2013, and stayed on levetiracetam  during the pregnancy. She had no seizures during pregnancy. She gave birth to 2 healthy twin girls, who are now 56 months old. Patient reports that she has not had significant seizures in about two years. Recently, she has been having seizures and would like to be assessed and medication changes made as appropriate.    Observations/Objective:  Generalized: Well developed, in no acute distress  Mentation: Alert oriented to time, place, history taking. Follows all commands speech and language fluent   Assessment and Plan:  43 y.o. year old female  has a past medical history of Dermoid cyst of ovary, GAD (generalized anxiety disorder), GERD (gastroesophageal reflux disease), History of kidney stones, Localization-related epilepsy (HCC) (2012), PMDD (premenstrual dysphoric disorder), and PONV (postoperative nausea and vomiting). here with    ICD-10-CM   1. Localization-related epilepsy (HCC)  G40.109 zonisamide  (ZONEGRAN ) 100 MG capsule       Delina is doing very well from a seizure standpoint. We have discussed, in detail, concerns of increased risk for kidney stone production on zonisamide . She has failed levetiracetam  and lamotrigine  in the past and does not wish to change AED at this time. We will continue lacosamide  150mg  BID and zonisamide  100mg  QHS. Seizure precautions advised. Healthy lifestyle habits encouraged. She will follow up in 1 year, sooner if needed.   No orders of the defined types were placed in this encounter.   Meds ordered this encounter  Medications   zonisamide  (ZONEGRAN ) 100 MG capsule    Sig: Take 1 capsule (100 mg total) by mouth at bedtime.    Dispense:  90 capsule    Refill:  3    Supervising Provider:   YAN, YIJUN [[id number]]   Lacosamide  150 MG TABS    Sig: Take 1 tablet (150 mg total) by mouth 2 (two) times daily.    Dispense:  180 tablet    Refill:   1    Supervising Provider:   YAN, YIJUN [[id number]]     Follow Up Instructions:  I discussed the assessment and treatment plan with the patient. The patient was provided an opportunity to ask questions and all were answered. The patient agreed with the plan and demonstrated an understanding of the instructions.   The patient was advised to call back or seek an in-person evaluation if the symptoms worsen or if the condition fails to improve as anticipated.  I provided 15 minutes of non-face-to-face time during this encounter. Patient located at their place of residence during Mychart visit. Provider is in the office.    Henlee Donovan, NP

## 2024-05-14 ENCOUNTER — Encounter: Payer: Self-pay | Admitting: Family Medicine

## 2024-05-14 ENCOUNTER — Telehealth (INDEPENDENT_AMBULATORY_CARE_PROVIDER_SITE_OTHER): Payer: Managed Care, Other (non HMO) | Admitting: Family Medicine

## 2024-05-14 DIAGNOSIS — G40109 Localization-related (focal) (partial) symptomatic epilepsy and epileptic syndromes with simple partial seizures, not intractable, without status epilepticus: Secondary | ICD-10-CM | POA: Diagnosis not present

## 2024-05-14 MED ORDER — ZONISAMIDE 100 MG PO CAPS: 100.0000 mg | ORAL_CAPSULE | Freq: Every day | ORAL | 3 refills | Status: AC

## 2024-05-14 MED ORDER — LACOSAMIDE 150 MG PO TABS: 1.0000 | ORAL_TABLET | Freq: Two times a day (BID) | ORAL | 1 refills | Status: DC

## 2024-05-26 ENCOUNTER — Encounter: Payer: Self-pay | Admitting: Family Medicine

## 2024-05-27 MED ORDER — LACOSAMIDE 150 MG PO TABS: 1.0000 | ORAL_TABLET | Freq: Two times a day (BID) | ORAL | 1 refills | Status: AC

## 2025-05-17 ENCOUNTER — Telehealth: Admitting: Family Medicine
